# Patient Record
Sex: Female | Born: 1955 | Race: White | Hispanic: No | Smoking: Never smoker
Health system: Southern US, Community
[De-identification: ages and names within clinical notes are randomized; demographics above are authoritative.]

## PROBLEM LIST (undated history)

## (undated) DIAGNOSIS — M1611 Unilateral primary osteoarthritis, right hip: Secondary | ICD-10-CM

## (undated) DIAGNOSIS — R7989 Other specified abnormal findings of blood chemistry: Secondary | ICD-10-CM

## (undated) DIAGNOSIS — R011 Cardiac murmur, unspecified: Secondary | ICD-10-CM

## (undated) DIAGNOSIS — R7309 Other abnormal glucose: Secondary | ICD-10-CM

## (undated) DIAGNOSIS — M810 Age-related osteoporosis without current pathological fracture: Secondary | ICD-10-CM

## (undated) HISTORY — DX: Other abnormal glucose: R73.09

## (undated) HISTORY — DX: Cardiac murmur, unspecified: R01.1

## (undated) HISTORY — DX: Age-related osteoporosis without current pathological fracture: M81.0

## (undated) HISTORY — DX: Unilateral primary osteoarthritis, right hip: M16.11

## (undated) HISTORY — DX: Other specified abnormal findings of blood chemistry: R79.89

---

## 1999-05-22 ENCOUNTER — Encounter: Admission: RE | Admit: 1999-05-22 | Discharge: 1999-05-22 | Payer: Self-pay | Admitting: Obstetrics and Gynecology

## 1999-05-22 ENCOUNTER — Encounter: Payer: Self-pay | Admitting: Obstetrics and Gynecology

## 2000-02-18 ENCOUNTER — Other Ambulatory Visit: Admission: RE | Admit: 2000-02-18 | Discharge: 2000-02-18 | Payer: Self-pay | Admitting: *Deleted

## 2000-02-18 ENCOUNTER — Encounter: Payer: Self-pay | Admitting: *Deleted

## 2000-02-18 ENCOUNTER — Encounter: Admission: RE | Admit: 2000-02-18 | Discharge: 2000-02-18 | Payer: Self-pay | Admitting: *Deleted

## 2000-06-30 ENCOUNTER — Encounter: Admission: RE | Admit: 2000-06-30 | Discharge: 2000-06-30 | Payer: Self-pay | Admitting: *Deleted

## 2000-06-30 ENCOUNTER — Encounter: Payer: Self-pay | Admitting: *Deleted

## 2001-05-07 ENCOUNTER — Other Ambulatory Visit: Admission: RE | Admit: 2001-05-07 | Discharge: 2001-05-07 | Payer: Self-pay | Admitting: Obstetrics and Gynecology

## 2001-07-31 ENCOUNTER — Encounter: Payer: Self-pay | Admitting: *Deleted

## 2001-07-31 ENCOUNTER — Encounter: Admission: RE | Admit: 2001-07-31 | Discharge: 2001-07-31 | Payer: Self-pay | Admitting: *Deleted

## 2001-08-06 ENCOUNTER — Encounter: Payer: Self-pay | Admitting: *Deleted

## 2001-08-06 ENCOUNTER — Encounter: Admission: RE | Admit: 2001-08-06 | Discharge: 2001-08-06 | Payer: Self-pay | Admitting: *Deleted

## 2002-05-26 ENCOUNTER — Other Ambulatory Visit: Admission: RE | Admit: 2002-05-26 | Discharge: 2002-05-26 | Payer: Self-pay | Admitting: Obstetrics and Gynecology

## 2002-09-03 ENCOUNTER — Encounter: Payer: Self-pay | Admitting: Obstetrics and Gynecology

## 2002-09-03 ENCOUNTER — Encounter: Admission: RE | Admit: 2002-09-03 | Discharge: 2002-09-03 | Payer: Self-pay | Admitting: Obstetrics and Gynecology

## 2003-05-21 HISTORY — PX: BRAIN SURGERY: SHX531

## 2003-09-09 ENCOUNTER — Encounter: Admission: RE | Admit: 2003-09-09 | Discharge: 2003-09-09 | Payer: Self-pay | Admitting: Obstetrics and Gynecology

## 2003-09-30 ENCOUNTER — Other Ambulatory Visit: Admission: RE | Admit: 2003-09-30 | Discharge: 2003-09-30 | Payer: Self-pay | Admitting: Obstetrics and Gynecology

## 2003-12-30 ENCOUNTER — Encounter: Admission: RE | Admit: 2003-12-30 | Discharge: 2003-12-30 | Payer: Self-pay | Admitting: Obstetrics and Gynecology

## 2004-04-09 ENCOUNTER — Encounter: Admission: RE | Admit: 2004-04-09 | Discharge: 2004-04-09 | Payer: Self-pay | Admitting: Neurology

## 2004-04-25 ENCOUNTER — Encounter (INDEPENDENT_AMBULATORY_CARE_PROVIDER_SITE_OTHER): Payer: Self-pay | Admitting: Specialist

## 2004-04-25 ENCOUNTER — Inpatient Hospital Stay (HOSPITAL_COMMUNITY): Admission: RE | Admit: 2004-04-25 | Discharge: 2004-04-30 | Payer: Self-pay | Admitting: Neurosurgery

## 2004-07-06 ENCOUNTER — Encounter: Admission: RE | Admit: 2004-07-06 | Discharge: 2004-07-06 | Payer: Self-pay | Admitting: Obstetrics and Gynecology

## 2004-10-26 ENCOUNTER — Encounter: Admission: RE | Admit: 2004-10-26 | Discharge: 2004-10-26 | Payer: Self-pay | Admitting: Obstetrics and Gynecology

## 2005-11-21 ENCOUNTER — Encounter: Admission: RE | Admit: 2005-11-21 | Discharge: 2005-11-21 | Payer: Self-pay | Admitting: Obstetrics and Gynecology

## 2006-11-28 ENCOUNTER — Encounter: Admission: RE | Admit: 2006-11-28 | Discharge: 2006-11-28 | Payer: Self-pay | Admitting: Obstetrics and Gynecology

## 2007-12-04 ENCOUNTER — Encounter: Admission: RE | Admit: 2007-12-04 | Discharge: 2007-12-04 | Payer: Self-pay | Admitting: Obstetrics and Gynecology

## 2008-09-16 DIAGNOSIS — D229 Melanocytic nevi, unspecified: Secondary | ICD-10-CM

## 2008-09-16 HISTORY — DX: Melanocytic nevi, unspecified: D22.9

## 2008-12-16 ENCOUNTER — Encounter: Admission: RE | Admit: 2008-12-16 | Discharge: 2008-12-16 | Payer: Self-pay | Admitting: Obstetrics and Gynecology

## 2010-01-05 ENCOUNTER — Encounter: Admission: RE | Admit: 2010-01-05 | Discharge: 2010-01-05 | Payer: Self-pay | Admitting: Obstetrics and Gynecology

## 2010-10-05 NOTE — H&P (Signed)
NAME:  Melanie Patel, Melanie Patel               ACCOUNT NO.:  [id number]   MEDICAL RECORD NO.:  [id number]          PATIENT TYPE:  INP   LOCATION:  NA                           FACILITY:  [facility]   PHYSICIAN:  Payton Doughty, M.D.      DATE OF BIRTH:  Sep 24, 1955   DATE OF ADMISSION:  04/25/2004  DATE OF DISCHARGE:                                HISTORY & PHYSICAL   ADMITTING DIAGNOSIS:  Fourth ventricular tumor.   SERVICE:  Neurosurgery.   BODY OF TEXT:  This is a very nice 55 year old right-handed white woman who  in August of 2005 had an episode of diplopia that resolved.  She had another  episode week before last and ended up with an MRI that shows an enhancing  lesion of the fourth ventricle without hydrocephalus, and Dr. Genene Churn. Love  sent her over to me.   PAST MEDICAL HISTORY:  Medical history is benign.   MEDICATIONS:  None.   ALLERGIES:  None.   SURGICAL HISTORY:  None.   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   FAMILY HISTORY:  Family history is not given at this time.   REVIEW OF SYSTEMS:  Review of systems are unremarkable for anything except  double and blurred vision.   PHYSICAL EXAMINATION:  HEENT:  Her HEENT exam is within normal limits.  She  has no papilledema.  NECK:  Neck is supple with no lymphadenopathy.  CHEST:  Clear.  CARDIAC:  Regular rate and rhythm with a 1/6 systolic murmur.  ABDOMEN:  Abdomen is nontender with no hepatosplenomegaly.  EXTREMITIES:  Extremities without clubbing or cyanosis.  GU:  Exam is deferred.  PERIPHERAL VASCULAR:  Peripheral pulses are good.  NEUROLOGIC:  Neurologically, she is awake, alert and oriented.  Her cranial  nerves are intact.  Pupils are equal, round and reactive to light.  Extraocular movements are intact.  Facial movement and sensation are intact.  Tongue protrudes in the midline.  Shoulder shrug is normal.  She had some  twitching in the left face, but that is gone.  There are no  swallowing  difficulties.  Motor exam shows 5/5 strength throughout the upper and lower  extremities.  There is no pronator drift.  There is no evidence of dystaxia  and there is no Hoffmann's.  Reflexes are normal.   IMAGING STUDIES:  MR demonstrates an enhancing lesion of the fourth  ventricle.  It is heterogeneously enhancing to either side and __________  invades the ependymoma.  There is no evident edema associated with it and  the patient does not have hydrocephalus.  Secondly, she has a lesion in the  anterior pons which is enhancing, circular and somewhat less distinct.   CLINICAL IMPRESSION:  Fourth ventricular tumor.  Likely candidates include  ependymoma, subependymoma, choroid plexus papilloma or pilocystic  astrocytoma.  I doubt if it is a medulloblastoma in group.  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   PLAN:  Plan is for resection.  The risks and benefits of this approach  have  been discussed with her and she wishes to proceed.       MWR/MEDQ  D:  04/25/2004  T:  04/25/2004  Job:  [id number]

## 2010-10-05 NOTE — Op Note (Signed)
NAME:  HOLLEIGH, CRIHFIELD NO.:  [id number]   MEDICAL RECORD NO.:  [id number]          PATIENT TYPE:  INP   LOCATION:  2855                         FACILITY:  [facility]   PHYSICIAN:  Payton Doughty, M.D.      DATE OF BIRTH:  11-08-1955   DATE OF PROCEDURE:  04/25/2004  DATE OF DISCHARGE:                                 OPERATIVE REPORT   REFERRING PHYSICIAN:  Genene Churn. Love, M.D.   PREOPERATIVE DIAGNOSIS:  Fourth ventricular tumor.   POSTOPERATIVE DIAGNOSIS:  Fourth ventricular tumor.   PROCEDURE:  Posterior fossa craniotomy for tumor resection.   SURGEON:  Payton Doughty, M.D.   ANESTHESIA:  General endotracheal anesthesia.   PREPARATION:  Prepped and scrubbed with alcohol wipe.   COMPLICATIONS:  None.   NURSING ASSISTANT:  [place].   ASSISTANT:  Stefani Dama, M.D.   A 55 year old right-handed white female with tumor of the fourth ventricle.  Taken to the operating room, smoothly anesthetized and intubated.  Placed in  [place] headholder in the prone position.  Following shave, prep and drape  in the usual sterile fashion skin was infiltrated with 1% lidocaine with  1:400,000 epinephrine.  Skin was incised from the inion down to C2 and the  arch of C1 and the occipital squamo were exposed.  Craniectomy was performed  up to the transverse sinus about 4 cm wide.  I think the C1 was partly  removed.  The cerebellar hemispheres and tonsils as well as the vermis were  identified.  The dura was opened in cruciform pattern.  The cottonoid was  inserted up the obex into the floor of the fourth ventricle and some tumor  was visualized.  The vermis was resected in its inferior third exposing the  lateral margins of the tumor in fourth ventricle.  The tumor was grasped and  easily removed without difficulty. There was one vascular pedicle that was  coagulated and divided.  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  Fourth ventricle was carefully  inspected and no residual  tumors demonstrated.  Carefully irrigated and Valsalva demonstrated good  egress of spinal fluid into the fourth and out.  The entire brain was irrigated.  Hemostasis assured.  The dura  reapproximated in nonwatertight fashion and covered with Surgicel.  The  paraspinous muscle, fascia and skin were closed in successive layers of 2-0  Vicryl, 3-0 Vicryl and 3-0 nylon.  Betadine and Telfa dressing was applied.  The patient was returned to the recovery room.       MWR/MEDQ  D:  04/25/2004  T:  04/25/2004  Job:  [id number]   cc:   Genene Churn. Love, M.D.  [address]  [address]  [place]  [place] [address]  Fax: [phone]

## 2010-10-05 NOTE — Discharge Summary (Signed)
Melanie Patel, Melanie Patel               ACCOUNT NO.:  [id number]   MEDICAL RECORD NO.:  [id number]          PATIENT TYPE:  INP   LOCATION:  3103                         FACILITY:  [facility]   PHYSICIAN:  Payton Doughty, M.D.      DATE OF BIRTH:  1955-12-10   DATE OF ADMISSION:  04/25/2004  DATE OF DISCHARGE:  04/30/2004                                 DISCHARGE SUMMARY   ADMISSION DIAGNOSIS:  Posterior fossa brain tumor.   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   PROCEDURE:  Posterior fossa craniectomy for tumor resection.   HISTORY OF PRESENT ILLNESS:  This is a 55 year old white woman whose history  and physical is recounted on the chart.  She has had some episodes of  diplopia and MRI showed a posterior fossa tumor and she was sent to my  office.   PAST MEDICAL HISTORY:  Benign.   MEDICATIONS:  None.   ALLERGIES:  No known drug allergies.   SOCIAL HISTORY:  None.   PHYSICAL EXAMINATION:  GENERAL:  Intact.  NEUROLOGY:  Intact.   HOSPITAL COURSE:  She was admitted after ascertaining laboratory values and  underwent a posterior fossa tumor resection.  She did not require  ventriculostomy.  She has had some mild dystaxia, mild nausea and vomiting.  This has slowly resolved.  She has been up walking about.  She is now mildly  dystaxic.  Her strength is full.  She is eating and voiding normally.  She  is being discharged home in the care of her family.   FOLLOW UP:  She will follow up in the [facility]  office in a week.       MWR/MEDQ  D:  04/30/2004  T:  04/30/2004  Job:  [id number]

## 2010-12-24 ENCOUNTER — Other Ambulatory Visit: Payer: Self-pay | Admitting: Obstetrics and Gynecology

## 2010-12-24 DIAGNOSIS — Z1231 Encounter for screening mammogram for malignant neoplasm of breast: Secondary | ICD-10-CM

## 2011-01-18 ENCOUNTER — Ambulatory Visit
Admission: RE | Admit: 2011-01-18 | Discharge: 2011-01-18 | Disposition: A | Discharge status: Home / Self Care | Payer: BC Managed Care – PPO | Source: Ambulatory Visit | Attending: Obstetrics and Gynecology | Admitting: Obstetrics and Gynecology

## 2011-01-18 DIAGNOSIS — Z1231 Encounter for screening mammogram for malignant neoplasm of breast: Secondary | ICD-10-CM

## 2012-01-03 ENCOUNTER — Other Ambulatory Visit: Payer: Self-pay | Admitting: Obstetrics and Gynecology

## 2012-01-03 DIAGNOSIS — Z1231 Encounter for screening mammogram for malignant neoplasm of breast: Secondary | ICD-10-CM

## 2012-01-24 ENCOUNTER — Ambulatory Visit
Admission: RE | Admit: 2012-01-24 | Discharge: 2012-01-24 | Disposition: A | Discharge status: Home / Self Care | Payer: BC Managed Care – PPO | Source: Ambulatory Visit | Attending: Obstetrics and Gynecology | Admitting: Obstetrics and Gynecology

## 2012-01-24 DIAGNOSIS — Z1231 Encounter for screening mammogram for malignant neoplasm of breast: Secondary | ICD-10-CM

## 2012-05-08 ENCOUNTER — Other Ambulatory Visit: Payer: Self-pay | Admitting: Physician Assistant

## 2012-12-29 ENCOUNTER — Other Ambulatory Visit: Payer: Self-pay

## 2012-12-29 DIAGNOSIS — Z1231 Encounter for screening mammogram for malignant neoplasm of breast: Secondary | ICD-10-CM

## 2013-02-12 ENCOUNTER — Ambulatory Visit
Admission: RE | Admit: 2013-02-12 | Discharge: 2013-02-12 | Disposition: A | Discharge status: Home / Self Care | Payer: BC Managed Care – PPO | Source: Ambulatory Visit

## 2013-02-12 DIAGNOSIS — Z1231 Encounter for screening mammogram for malignant neoplasm of breast: Secondary | ICD-10-CM

## 2013-12-31 ENCOUNTER — Other Ambulatory Visit: Payer: Self-pay

## 2013-12-31 DIAGNOSIS — Z1231 Encounter for screening mammogram for malignant neoplasm of breast: Secondary | ICD-10-CM

## 2014-01-03 ENCOUNTER — Other Ambulatory Visit: Payer: Self-pay | Admitting: Obstetrics and Gynecology

## 2014-01-03 DIAGNOSIS — M949 Disorder of cartilage, unspecified: Principal | ICD-10-CM

## 2014-01-03 DIAGNOSIS — M899 Disorder of bone, unspecified: Secondary | ICD-10-CM

## 2014-02-18 ENCOUNTER — Ambulatory Visit
Admission: RE | Admit: 2014-02-18 | Discharge: 2014-02-18 | Disposition: A | Discharge status: Home / Self Care | Payer: BC Managed Care – PPO | Source: Ambulatory Visit

## 2014-02-18 ENCOUNTER — Ambulatory Visit
Admission: RE | Admit: 2014-02-18 | Discharge: 2014-02-18 | Disposition: A | Discharge status: Home / Self Care | Payer: BC Managed Care – PPO | Source: Ambulatory Visit | Attending: Obstetrics and Gynecology | Admitting: Obstetrics and Gynecology

## 2014-02-18 DIAGNOSIS — M899 Disorder of bone, unspecified: Secondary | ICD-10-CM

## 2014-02-18 DIAGNOSIS — M949 Disorder of cartilage, unspecified: Principal | ICD-10-CM

## 2014-02-18 DIAGNOSIS — Z1231 Encounter for screening mammogram for malignant neoplasm of breast: Secondary | ICD-10-CM

## 2015-01-24 ENCOUNTER — Other Ambulatory Visit: Payer: Self-pay

## 2015-01-24 DIAGNOSIS — Z1231 Encounter for screening mammogram for malignant neoplasm of breast: Secondary | ICD-10-CM

## 2015-03-10 ENCOUNTER — Ambulatory Visit: Payer: Self-pay

## 2015-04-25 ENCOUNTER — Other Ambulatory Visit: Payer: Self-pay | Admitting: Physician Assistant

## 2015-04-28 ENCOUNTER — Ambulatory Visit
Admission: RE | Admit: 2015-04-28 | Discharge: 2015-04-28 | Disposition: A | Discharge status: Home / Self Care | Payer: BLUE CROSS/BLUE SHIELD | Source: Ambulatory Visit

## 2015-04-28 DIAGNOSIS — Z1231 Encounter for screening mammogram for malignant neoplasm of breast: Secondary | ICD-10-CM

## 2015-12-01 ENCOUNTER — Other Ambulatory Visit: Payer: Self-pay | Admitting: Physician Assistant

## 2016-02-13 ENCOUNTER — Other Ambulatory Visit: Payer: Self-pay | Admitting: Obstetrics and Gynecology

## 2016-02-13 DIAGNOSIS — Z1231 Encounter for screening mammogram for malignant neoplasm of breast: Secondary | ICD-10-CM

## 2016-04-30 ENCOUNTER — Ambulatory Visit
Admission: RE | Admit: 2016-04-30 | Discharge: 2016-04-30 | Disposition: A | Discharge status: Home / Self Care | Payer: BLUE CROSS/BLUE SHIELD | Source: Ambulatory Visit | Attending: Obstetrics and Gynecology | Admitting: Obstetrics and Gynecology

## 2016-04-30 DIAGNOSIS — Z1231 Encounter for screening mammogram for malignant neoplasm of breast: Secondary | ICD-10-CM

## 2016-05-03 ENCOUNTER — Ambulatory Visit: Payer: BLUE CROSS/BLUE SHIELD

## 2016-05-10 ENCOUNTER — Encounter: Payer: Self-pay | Admitting: Obstetrics and Gynecology

## 2016-05-10 ENCOUNTER — Ambulatory Visit (INDEPENDENT_AMBULATORY_CARE_PROVIDER_SITE_OTHER): Payer: BLUE CROSS/BLUE SHIELD | Admitting: Obstetrics and Gynecology

## 2016-05-10 VITALS — BP 122/70 | HR 68 | Resp 14 | Ht 68.75 in | Wt 134.4 lb

## 2016-05-10 DIAGNOSIS — Z0000 Encounter for general adult medical examination without abnormal findings: Secondary | ICD-10-CM | POA: Diagnosis not present

## 2016-05-10 DIAGNOSIS — Z119 Encounter for screening for infectious and parasitic diseases, unspecified: Secondary | ICD-10-CM

## 2016-05-10 DIAGNOSIS — M810 Age-related osteoporosis without current pathological fracture: Secondary | ICD-10-CM | POA: Diagnosis not present

## 2016-05-10 DIAGNOSIS — Z01419 Encounter for gynecological examination (general) (routine) without abnormal findings: Secondary | ICD-10-CM

## 2016-05-10 LAB — LIPID PANEL
CHOL/HDL RATIO: 2.6 ratio (ref <5.0)
CHOLESTEROL: 245 mg/dL — AB (ref <200)
HDL: 96 mg/dL (ref >50)
LDL Cholesterol: 127 mg/dL — ABNORMAL HIGH (ref <100)
TRIGLYCERIDES: 109 mg/dL (ref <150)
VLDL: 22 mg/dL (ref <30)

## 2016-05-10 LAB — CBC
HEMATOCRIT: 43.1 % (ref 35.0–45.0)
HEMOGLOBIN: 14.1 g/dL (ref 11.7–15.5)
MCH: 29.6 pg (ref 27.0–33.0)
MCHC: 32.7 g/dL (ref 32.0–36.0)
MCV: 90.5 fL (ref 80.0–100.0)
MPV: 10.5 fL (ref 7.5–12.5)
Platelets: 259 10*3/uL (ref 140–400)
RBC: 4.76 MIL/uL (ref 3.80–5.10)
RDW: 13.9 % (ref 11.0–15.0)
WBC: 6.9 10*3/uL (ref 3.8–10.8)

## 2016-05-10 LAB — COMPREHENSIVE METABOLIC PANEL
ALBUMIN: 4.7 g/dL (ref 3.6–5.1)
ALT: 19 U/L (ref 6–29)
AST: 25 U/L (ref 10–35)
Alkaline Phosphatase: 51 U/L (ref 33–130)
BUN: 17 mg/dL (ref 7–25)
CALCIUM: 9.2 mg/dL (ref 8.6–10.4)
CHLORIDE: 103 mmol/L (ref 98–110)
CO2: 27 mmol/L (ref 20–31)
Creat: 0.7 mg/dL (ref 0.50–1.05)
Glucose, Bld: 83 mg/dL (ref 65–99)
POTASSIUM: 4.2 mmol/L (ref 3.5–5.3)
SODIUM: 139 mmol/L (ref 135–146)
Total Bilirubin: 0.5 mg/dL (ref 0.2–1.2)
Total Protein: 7.6 g/dL (ref 6.1–8.1)

## 2016-05-10 LAB — TSH: TSH: 1.5 mIU/L

## 2016-05-10 LAB — HEMOGLOBIN A1C
Hgb A1c MFr Bld: 5.5 % (ref <5.7)
MEAN PLASMA GLUCOSE: 111 mg/dL

## 2016-05-10 NOTE — Patient Instructions (Signed)

## 2016-05-10 NOTE — Progress Notes (Signed)
60 y.o. G31P0000 Married Caucasian female here for annual exam.    On Fosamax for osteoporosis for the last 2 years.  Would like to stop this.  Due for a bone density.   Mother had breast cancer, dx at age 26.  Deceased about 5 years later from metastatic disease.  Needs labs drawn.  Is a PT at [employer].  PCP:   No PCP  No LMP recorded. Patient is postmenopausal.     LMP was 05/10/2008   Sexually active: Yes.    The current method of family planning is female partner.    Exercising: Yes.    walking, elliptical, weights Smoker:  no  Health Maintenance: Pap:  1-2 years ago per patient unsure of exact date History of abnormal Pap:  no MMG:  04/30/16 BIRADS 1 negative Colonoscopy:  05/29/15 - polyp per patient normal BMD:   02/2014  Result  Osteoporosis.  Spine -1.7, L hip -2.7 TDaP:  Unsure  Hep C:  Today. Screening Labs:    Urine today:    reports that she has never smoked. She has never used smokeless tobacco. She reports that she drinks alcohol. She reports that she does not use drugs.  Past Medical History:  Diagnosis Date  . Heart murmur   . Osteoporosis     Past Surgical History:  Procedure Laterality Date  . BRAIN SURGERY  2005   Choroid Plexus papilloma Benign    Current Outpatient Prescriptions  Medication Sig Dispense Refill  . alendronate (FOSAMAX) 70 MG tablet Take 1 tablet by mouth once a week.  1  . Calcium Carbonate (CALCIUM 600 PO) Take 1 capsule by mouth 2 (two) times daily.    . Multiple Vitamins-Minerals (MULTIVITAMIN ADULT PO) Take 1 tablet by mouth daily.     No current facility-administered medications for this visit.     Family History  Problem Relation Age of Onset  . Breast cancer Mother   . Cancer Mother   . COPD Father   . Heart disease Father   . Deep vein thrombosis Father   . Osteoporosis Sister   . Multiple sclerosis Sister   . Bipolar disorder Sister     ROS:  Pertinent items are noted in HPI.  Otherwise, a  comprehensive ROS was negative.  Exam:   BP 122/70 (BP Location: Right Arm, Patient Position: Sitting, Cuff Size: Normal)   Pulse 68   Resp 14   Ht 5' 8.75" (1.746 m)   Wt 134 lb 6.4 oz (61 kg)   BMI 19.99 kg/m     General appearance: alert, cooperative and appears stated age Head: Normocephalic, without obvious abnormality, atraumatic Neck: no adenopathy, supple, symmetrical, trachea midline and thyroid normal to inspection and palpation Lungs: clear to auscultation bilaterally Breasts: normal appearance, no masses or tenderness, No nipple retraction or dimpling, No nipple discharge or bleeding, No axillary or supraclavicular adenopathy Heart: regular rate and rhythm Abdomen: soft, non-tender; no masses, no organomegaly Extremities: extremities normal, atraumatic, no cyanosis or edema Skin: Skin color, texture, turgor normal. No rashes or lesions Lymph nodes: Cervical, supraclavicular, and axillary nodes normal. No abnormal inguinal nodes palpated Neurologic: Grossly normal  Pelvic: External genitalia:  no lesions              Urethra:  normal appearing urethra with no masses, tenderness or lesions              Bartholins and Skenes: normal  Vagina: normal appearing vagina with normal color and discharge, no lesions              Cervix: no lesions              Pap taken: Yes.   Bimanual Exam:  Uterus:  normal size, contour, position, consistency, mobility, non-tender              Adnexa: no mass, fullness, tenderness              Rectal exam: Yes.  .  Confirms.              Anus:  normal sphincter tone, no lesions  Chaperone was present for exam.  Assessment:   Well woman visit with normal exam. FH of breast CA. Osteoporosis.  On Fosamax.  Plan: Mammogram screening discussed. Recommended self breast awareness. Pap and HR HPV as above. Guidelines for Calcium, Vitamin D, regular exercise program including cardiovascular and weight bearing exercise. Routine  labs, Hgb A1C, HIV, Hep C. BMD.  Follow up annually and prn.      After visit summary provided.

## 2016-05-11 LAB — HIV ANTIBODY (ROUTINE TESTING W REFLEX): HIV: NONREACTIVE

## 2016-05-11 LAB — VITAMIN D 25 HYDROXY (VIT D DEFICIENCY, FRACTURES): Vit D, 25-Hydroxy: 37 ng/mL (ref 30–100)

## 2016-05-11 LAB — HEPATITIS C ANTIBODY: HCV AB: NEGATIVE

## 2016-05-15 ENCOUNTER — Telehealth: Payer: Self-pay | Admitting: *Deleted

## 2016-05-15 LAB — IPS PAP TEST WITH HPV

## 2016-05-15 NOTE — Telephone Encounter (Signed)
-----   Message from Nunzio Cobbs, MD sent at 05/14/2016  6:29 PM EST ----- Please inform patient of results.  Cholesterol panel showed elevated total cholesterol at level of 245.  This is mostly because her good cholesterol, the HDL, was 96!   The LDL cholesterol was 127, which is just mildly elevated.   I am not concerned about these numbers as the ratios overall indicate low cardiovascular risk.  This was also not a fasting lab.   The thyroid, blood counts, blood chemistries, hemoglobin A1C, and vitamin D were all normal.  The HIV and hep C aby were negative.   Pap is pending.   Cc- Marisa Sprinkles

## 2016-05-15 NOTE — Telephone Encounter (Signed)
Left message to call Gentle Hoge at [phone].  

## 2016-05-16 LAB — POCT URINALYSIS DIPSTICK
Bilirubin, UA: NEGATIVE
Blood, UA: NEGATIVE
Glucose, UA: NEGATIVE
KETONES UA: NEGATIVE
LEUKOCYTES UA: NEGATIVE
Nitrite, UA: NEGATIVE
PH UA: 5
PROTEIN UA: NEGATIVE
UROBILINOGEN UA: NEGATIVE

## 2016-05-16 NOTE — Telephone Encounter (Signed)
Spoke with patient, advised of results as seen below per Dr. Quincy Simmonds. Patient verbalizes understanding and is agreeable. Results released to [place].  Routing to provider for final review. Patient is agreeable to disposition. Will close encounter.

## 2016-06-05 ENCOUNTER — Other Ambulatory Visit: Payer: BLUE CROSS/BLUE SHIELD

## 2016-06-11 ENCOUNTER — Ambulatory Visit
Admission: RE | Admit: 2016-06-11 | Discharge: 2016-06-11 | Disposition: A | Discharge status: Home / Self Care | Payer: BLUE CROSS/BLUE SHIELD | Source: Ambulatory Visit | Attending: Obstetrics and Gynecology | Admitting: Obstetrics and Gynecology

## 2016-06-11 DIAGNOSIS — M810 Age-related osteoporosis without current pathological fracture: Secondary | ICD-10-CM

## 2016-06-12 ENCOUNTER — Encounter: Payer: Self-pay | Admitting: Obstetrics and Gynecology

## 2016-06-12 ENCOUNTER — Other Ambulatory Visit: Payer: Self-pay | Admitting: Obstetrics and Gynecology

## 2016-06-12 MED ORDER — ALENDRONATE SODIUM 70 MG PO TABS: 70.0000 mg | ORAL_TABLET | ORAL | 11 refills | Status: DC

## 2017-05-17 ENCOUNTER — Other Ambulatory Visit: Payer: Self-pay | Admitting: Obstetrics and Gynecology

## 2017-05-19 NOTE — Telephone Encounter (Signed)
Medication refill request: fosamax Last AEX:  05/10/16 BS  Next AEX: detailed message left for patient to return call to schedule aex Last MMG (if hormonal medication request): 04/30/16 BIRADS 1 negative  Refill authorized: 06/12/16 #4, 11 RF. Today, please advise.

## 2017-05-21 ENCOUNTER — Other Ambulatory Visit: Payer: Self-pay | Admitting: Obstetrics and Gynecology

## 2017-05-21 DIAGNOSIS — Z1231 Encounter for screening mammogram for malignant neoplasm of breast: Secondary | ICD-10-CM

## 2017-06-13 ENCOUNTER — Ambulatory Visit
Admission: RE | Admit: 2017-06-13 | Discharge: 2017-06-13 | Disposition: A | Discharge status: Home / Self Care | Payer: BLUE CROSS/BLUE SHIELD | Source: Ambulatory Visit | Attending: Obstetrics and Gynecology | Admitting: Obstetrics and Gynecology

## 2017-06-13 DIAGNOSIS — Z1231 Encounter for screening mammogram for malignant neoplasm of breast: Secondary | ICD-10-CM

## 2017-07-11 ENCOUNTER — Ambulatory Visit: Payer: BLUE CROSS/BLUE SHIELD | Admitting: Obstetrics and Gynecology

## 2017-07-11 ENCOUNTER — Other Ambulatory Visit: Payer: Self-pay

## 2017-07-11 ENCOUNTER — Encounter: Payer: Self-pay | Admitting: Obstetrics and Gynecology

## 2017-07-11 VITALS — BP 126/78 | HR 72 | Resp 16 | Ht 68.25 in | Wt 132.0 lb

## 2017-07-11 DIAGNOSIS — Z23 Encounter for immunization: Secondary | ICD-10-CM

## 2017-07-11 DIAGNOSIS — Z01419 Encounter for gynecological examination (general) (routine) without abnormal findings: Secondary | ICD-10-CM | POA: Diagnosis not present

## 2017-07-11 NOTE — Patient Instructions (Signed)

## 2017-07-11 NOTE — Progress Notes (Signed)
62 y.o. G48P0000 Married Caucasian female here for annual exam.    Constipation.  States she needs to drink more water.   Has headache and not sure if she has sinus infection.  Declines evaluation.   Stopped Fosamax in December 2018. Wants to [place] with dentist now that she is off Fosamax.   PCP:   No PCP  Patient's last menstrual period was 05/10/2008.           Sexually active: Yes.    The current method of family planning is postmenopausal/ female partner.    Exercising: Yes.    walking, weight training, and biking Smoker:  no  Health Maintenance: Pap:  05/10/16 Pap and HR HPV negative History of abnormal Pap:  no MMG:  06/13/17 BIRADS 1 negative/density c Colonoscopy:  05/29/15 - polyp per patient normal BMD:   06/11/16  Result  Osteopenia TDaP:  unsure Gardasil:   n/a HIV and Hep C: 05/10/16 Negative Screening Labs:  Discuss today   reports that  has never smoked. she has never used smokeless tobacco. She reports that she drinks alcohol. She reports that she does not use drugs.  Past Medical History:  Diagnosis Date  . Heart murmur   . Osteoporosis     Past Surgical History:  Procedure Laterality Date  . BRAIN SURGERY  2005   Choroid Plexus papilloma Benign    Current Outpatient Medications  Medication Sig Dispense Refill  . Calcium Carbonate (CALCIUM 600 PO) Take 1 capsule by mouth 2 (two) times daily.    . Multiple Vitamins-Minerals (MULTIVITAMIN ADULT PO) Take 1 tablet by mouth daily.     No current facility-administered medications for this visit.     Family History  Problem Relation Age of Onset  . Breast cancer Mother 47  . Cancer Mother   . COPD Father   . Heart disease Father   . Deep vein thrombosis Father   . Osteoporosis Sister   . Multiple sclerosis Sister   . Bipolar disorder Sister     ROS:  Pertinent items are noted in HPI.  Otherwise, a comprehensive ROS was negative.  Exam:   BP 126/78 (BP Location: Right Arm, Patient  Position: Sitting, Cuff Size: Normal)   Pulse 72   Resp 16   Ht 5' 8.25" (1.734 m)   Wt 132 lb (59.9 kg)   LMP 05/10/2008   BMI 19.92 kg/m     General appearance: alert, cooperative and appears stated age Head: Normocephalic, without obvious abnormality, atraumatic Neck: no adenopathy, supple, symmetrical, trachea midline and thyroid normal to inspection and palpation Lungs: clear to auscultation bilaterally Breasts: normal appearance, no masses or tenderness, No nipple retraction or dimpling, No nipple discharge or bleeding, No axillary or supraclavicular adenopathy Heart: regular rate and rhythm Abdomen: soft, non-tender; no masses, no organomegaly Extremities: extremities normal, atraumatic, no cyanosis or edema Skin: Skin color, texture, turgor normal. No rashes or lesions Lymph nodes: Cervical, supraclavicular, and axillary nodes normal. No abnormal inguinal nodes palpated Neurologic: Grossly normal  Pelvic: External genitalia:  no lesions              Urethra:  normal appearing urethra with no masses, tenderness or lesions              Bartholins and Skenes: normal                 Vagina: normal appearing vagina with normal color and discharge, no lesions  Cervix: no lesions              Pap taken:  No.  Bimanual Exam:  Uterus:  normal size, contour, position, consistency, mobility, non-tender              Adnexa: no mass, fullness, tenderness              Rectal exam: Yes.  .  Confirms.              Anus:  normal sphincter tone, no lesions  Chaperone was present for exam.  Assessment:   Well woman visit with normal exam. FH of breast CA.  Mother.  Osteoporosis.  Off Fosamax.  Plan: Mammogram screening discussed. Recommended self breast awareness. Pap and HR HPV as above. Guidelines for Calcium, Vitamin D, regular exercise program including cardiovascular and weight bearing exercise. Rx for Lipid profile, Vit D, and HgbA1C through her work.  She may  decide she does not want to do this if the cost is high.  BMD next year.  TDap. Follow up annually and prn.    After visit summary provided.

## 2017-07-25 ENCOUNTER — Other Ambulatory Visit: Payer: Self-pay | Admitting: Obstetrics and Gynecology

## 2017-08-06 ENCOUNTER — Telehealth: Payer: Self-pay | Admitting: Obstetrics and Gynecology

## 2017-08-06 DIAGNOSIS — R7989 Other specified abnormal findings of blood chemistry: Secondary | ICD-10-CM

## 2017-08-06 DIAGNOSIS — E7800 Pure hypercholesterolemia, unspecified: Secondary | ICD-10-CM

## 2017-08-06 DIAGNOSIS — R7309 Other abnormal glucose: Secondary | ICD-10-CM

## 2017-08-06 NOTE — Telephone Encounter (Signed)
Left message to call [place] at [phone].  Need more information. Where labs done with another office?

## 2017-08-06 NOTE — Telephone Encounter (Signed)
Patient returning call.

## 2017-08-06 NOTE — Telephone Encounter (Signed)
Patient had labs done at [facility] 07/25/17 and has not received results. They were supposed to be sent to Dr. Quincy Simmonds, as well. Can leave detailed message [phone].

## 2017-08-06 NOTE — Telephone Encounter (Signed)
Spoke with patient who states that she had lab draw at [facility]. Orders were from [facility] and requested these be sent to our office. Advised will check received results and return call with further information. Patient is agreeable.

## 2017-08-06 NOTE — Telephone Encounter (Signed)
Labs to [place] for review and advise.

## 2017-08-07 LAB — SPECIMEN STATUS REPORT

## 2017-08-07 LAB — HGB A1C W/O EAG: Hgb A1c MFr Bld: 5.9 % — ABNORMAL HIGH (ref 4.8–5.6)

## 2017-08-07 LAB — LIPID PANEL W/O CHOL/HDL RATIO
Cholesterol, Total: 240 mg/dL — ABNORMAL HIGH (ref 100–199)
HDL: 90 mg/dL (ref >39)
LDL Calculated: 133 mg/dL — ABNORMAL HIGH (ref 0–99)
Triglycerides: 86 mg/dL (ref 0–149)
VLDL Cholesterol Cal: 17 mg/dL (ref 5–40)

## 2017-08-07 LAB — VITAMIN D 25 HYDROXY (VIT D DEFICIENCY, FRACTURES): VIT D 25 HYDROXY: 29 ng/mL — AB (ref 30.0–100.0)

## 2017-08-07 NOTE — Telephone Encounter (Signed)
Left message to call Hadi Dubin at [phone]. 

## 2017-08-07 NOTE — Telephone Encounter (Signed)
I have received a copy of the patient's labs from [facility].  T cholesterol 240, TG 86, HDL 90, VLDL 17, LDL 133.  HgbA1C 5.9, which puts her is a prediabetic range.  Vit D 29, which is just under normal.   I would recommend: - a low cholesterol and low sugar/carb diet and have fasting labs rechecked again in 3 months. - vit D3 over the counter 800 IU daily which can also be rechecked again in 3 months.   We will need to place a future order for these to be rechecked at her lab of choice.

## 2017-08-08 NOTE — Telephone Encounter (Signed)
Spoke with patient. Advised of message as seen below from [place]. Patient verbalizes understanding. Patient would like to have 3 month testing at outside [facility]. Orders placed. Patient will call once she has had these drawn so that we can ensure we receive results. Patient verbalizes understanding.  Routing to provider for final review. Patient agreeable to disposition. Will close encounter.

## 2017-08-11 ENCOUNTER — Encounter: Payer: Self-pay | Admitting: Obstetrics and Gynecology

## 2017-08-12 ENCOUNTER — Telehealth: Payer: Self-pay | Admitting: Obstetrics and Gynecology

## 2017-08-12 NOTE — Telephone Encounter (Signed)
Call to patient. RN advised Melanie Patel was calling to schedule 3 month lab follow up. Patient states she plans to have blood work done at [facility] on [address] because that is close to where she works, and she will call to schedule a mid-late June appointment. RN advised future orders are present for blood work and patient will need to call the office once she has blood work done so we can assure we receive results. Patient verbalized understanding.   Patient agreeable to disposition. Will close encounter.

## 2017-08-12 NOTE — Telephone Encounter (Signed)
Patient states she is returning a call to [facility]. Lovena Le is not in the office today.

## 2017-10-21 ENCOUNTER — Other Ambulatory Visit: Payer: Self-pay

## 2017-10-21 ENCOUNTER — Telehealth: Payer: Self-pay | Admitting: Obstetrics and Gynecology

## 2017-10-21 DIAGNOSIS — R7989 Other specified abnormal findings of blood chemistry: Secondary | ICD-10-CM

## 2017-10-21 DIAGNOSIS — R7309 Other abnormal glucose: Secondary | ICD-10-CM

## 2017-10-21 DIAGNOSIS — E7800 Pure hypercholesterolemia, unspecified: Secondary | ICD-10-CM

## 2017-10-21 NOTE — Telephone Encounter (Signed)
Patient stated she was supposed to be getting repeat labs. She went down to [facility] on [address], and they have not received it. Wanting to know if it can be faxed to her personally to Attn: Aadya Kindler at [phone], and she take it down there personally.

## 2017-10-21 NOTE — Telephone Encounter (Signed)
Reviewed future labs for hgba1c, lipid panel and Vit D. Labs released, confirmed with [facility] tech available to view, requisition printed.   Call returned to patient, advised as seen above, apologies for any inconvenience. Confirmed fax number provided, will fax copy of lab requisition per patient request.  Patient thankful for return call.   Routing to provider for final review. Patient is agreeable to disposition. Will close encounter.

## 2017-11-06 ENCOUNTER — Telehealth: Payer: Self-pay | Admitting: Obstetrics and Gynecology

## 2017-11-06 NOTE — Telephone Encounter (Signed)
Routing to Dr. Antony Blackbird, will close encounter.

## 2017-11-06 NOTE — Telephone Encounter (Signed)
Patient stated that Dr. Quincy Simmonds requested that she contact us once her labs have been done. She had them done this morning.

## 2017-11-07 LAB — LIPID PANEL
CHOL/HDL RATIO: 2.7 ratio (ref 0.0–4.4)
Cholesterol, Total: 202 mg/dL — ABNORMAL HIGH (ref 100–199)
HDL: 76 mg/dL (ref >39)
LDL Calculated: 112 mg/dL — ABNORMAL HIGH (ref 0–99)
Triglycerides: 72 mg/dL (ref 0–149)
VLDL Cholesterol Cal: 14 mg/dL (ref 5–40)

## 2017-11-07 LAB — VITAMIN D 25 HYDROXY (VIT D DEFICIENCY, FRACTURES): VIT D 25 HYDROXY: 34.8 ng/mL (ref 30.0–100.0)

## 2017-11-07 LAB — HEMOGLOBIN A1C
ESTIMATED AVERAGE GLUCOSE: 123 mg/dL
HEMOGLOBIN A1C: 5.9 % — AB (ref 4.8–5.6)

## 2017-11-09 ENCOUNTER — Encounter: Payer: Self-pay | Admitting: Obstetrics and Gynecology

## 2017-11-10 ENCOUNTER — Other Ambulatory Visit: Payer: Self-pay | Admitting: Obstetrics and Gynecology

## 2017-11-10 ENCOUNTER — Telehealth: Payer: Self-pay | Admitting: Obstetrics and Gynecology

## 2017-11-10 DIAGNOSIS — R7309 Other abnormal glucose: Secondary | ICD-10-CM

## 2017-11-10 DIAGNOSIS — E7800 Pure hypercholesterolemia, unspecified: Secondary | ICD-10-CM

## 2017-11-10 NOTE — Telephone Encounter (Signed)
OK for hemoglobin A1C and fasting lipid profile in 6 months.  We will need to place future orders or have the patient call us back in 6 months so I can order it again.  I am not certain where she would like to have her blood drawn.

## 2017-11-10 NOTE — Telephone Encounter (Signed)
Order signed by me

## 2017-11-10 NOTE — Telephone Encounter (Signed)
Spoke with patient. Advised of message as seen below from [place]. Patient verbalizes understanding. Would like to have labs done at [facility]. Orders placed and released. Requesting a paper copy be faxed to her at [phone] so that she has this on hand as well. Order to [facility] for review and signature.

## 2017-11-10 NOTE — Telephone Encounter (Signed)
Order faxed to patient per her request. Encounter closed.

## 2017-11-10 NOTE — Telephone Encounter (Signed)
Patient sent the following message through [place]. Routing to triage to assist patient with request.  ----- Message from [place], Generic sent at 11/10/2017 12:38 PM EDT -----    Thank you for your quick response.. I would like to retest in 6 mos if that is OK and then if still elevated possibly see an endocrinologist or Diabetes specialist. I am frustrated to say the least. Hope you are well.  Virdia  ----- Message -----  From: Arloa Koh, MD  Sent: 11/10/2017 7:29 AM EDT  To: Claris Pong  Subject: RE: Non-Urgent Medical Question  Hi Nevah,     I hope you received my message last night.   Please let me know if you would like to see an endocrinologist.     Have a good day!    Josefa Half    ----- Message -----   From: Claris Pong   Sent: 11/09/2017 12:19 PM EDT    To: Arloa Koh, MD  Subject: Non-Urgent Medical Question    Hi Dr. Quincy Simmonds,  Thank you for so quickly adding in my lab results. I was devastated with my AIC remaining the same as 3 months ago as I have totally changed my eating habits. I am a carb and sweet lover and didnt eat pastas, pizza, sweets etc for the entire 3 months.  I have continued to exercise as much as my time allows, but not as much as I would like. Not sure why my HDL's would be so much lower than they have been, but I am OK with the cholesterol panel. I did think my ratio here would decrease as well. Do you have any other thoghts for lowering my AIC. I do eat blueberries on a daily basis, but would adding in more fresh berries/fruit help? Are there any other natural supplements that might help? I do not have a family hx of diabetes. I know I stay stressed with work and don't get the sleep I should. Do you think tumeric might help? Any thoughts you might have are appreciated. Many thanks, Melonie

## 2017-11-10 NOTE — Telephone Encounter (Signed)
Left message to call Wynetta Seith at [phone].  

## 2017-12-22 ENCOUNTER — Encounter

## 2018-05-14 ENCOUNTER — Telehealth: Payer: Self-pay | Admitting: Obstetrics and Gynecology

## 2018-05-14 ENCOUNTER — Encounter: Payer: Self-pay | Admitting: Obstetrics and Gynecology

## 2018-05-14 NOTE — Telephone Encounter (Signed)
Call to patient and scheduled office visit for tomorrow with Dr. Quincy Simmonds.  05/15/2018.  Left message with details.  Encounter closed.

## 2018-05-14 NOTE — Telephone Encounter (Signed)
Patient would like to be seen for breast check. Doctor recommended she be seen today or tomorrow, however she works until 6 pm today. Can come anytime tomorrow. Patient states OK to schedule if she does not answer call back and leave details on voicemail.

## 2018-05-14 NOTE — Progress Notes (Signed)
GYNECOLOGY  VISIT   HPI: 62 y.o.   Married  Caucasian  female   G0P0000 with Patient's last menstrual period was 05/10/2008.   here for right breast discomfort.    Twinge of pain 2 weeks ago that came and went.  Occurred again Christmas.  No lumps on exam. No trauma.  No skin rash.   No hormonal medication.   GYNECOLOGIC HISTORY: Patient's last menstrual period was 05/10/2008. Contraception: Postmenopausal/female partner Menopausal hormone therapy: none Last mammogram:  06/13/17 BIRADS 1 negative/density c Last pap smear:  05/10/16 Pap and HR HPV negative        OB History    Gravida  0   Para  0   Term  0   Preterm  0   AB  0   Living  0     SAB  0   TAB  0   Ectopic  0   Multiple  0   Live Births  0              There are no active problems to display for this patient.   Past Medical History:  Diagnosis Date  . Elevated hemoglobin A1c   . Heart murmur   . Low vitamin D level   . Osteoporosis     Past Surgical History:  Procedure Laterality Date  . BRAIN SURGERY  2005   Choroid Plexus papilloma Benign    Current Outpatient Medications  Medication Sig Dispense Refill  . Calcium Carbonate (CALCIUM 600 PO) Take 1 capsule by mouth 2 (two) times daily.    . Cholecalciferol (VITAMIN D3 PO) Take 1 capsule by mouth daily. 1000 iu    . Multiple Vitamins-Minerals (MULTIVITAMIN ADULT PO) Take 1 tablet by mouth daily.     No current facility-administered medications for this visit.      ALLERGIES: Percocet [oxycodone-acetaminophen]  Family History  Problem Relation Age of Onset  . Breast cancer Mother 49  . Cancer Mother   . COPD Father   . Heart disease Father   . Deep vein thrombosis Father   . Osteoporosis Sister   . Multiple sclerosis Sister   . Bipolar disorder Sister     Social History   Socioeconomic History  . Marital status: Married    Spouse name: Not on file  . Number of children: Not on file  . Years of education: Not on  file  . Highest education level: Not on file  Occupational History  . Not on file  Social Needs  . Financial resource strain: Not on file  . Food insecurity:    Worry: Not on file    Inability: Not on file  . Transportation needs:    Medical: Not on file    Non-medical: Not on file  Tobacco Use  . Smoking status: Never Smoker  . Smokeless tobacco: Never Used  Substance and Sexual Activity  . Alcohol use: Yes    Comment: once a month  . Drug use: No  . Sexual activity: Yes    Comment: female partner  Lifestyle  . Physical activity:    Days per week: Not on file    Minutes per session: Not on file  . Stress: Not on file  Relationships  . Social connections:    Talks on phone: Not on file    Gets together: Not on file    Attends religious service: Not on file    Active member of club or organization: Not on file  Attends meetings of clubs or organizations: Not on file    Relationship status: Not on file  . Intimate partner violence:    Fear of current or ex partner: Not on file    Emotionally abused: Not on file    Physically abused: Not on file    Forced sexual activity: Not on file  Other Topics Concern  . Not on file  Social History Narrative  . Not on file    Review of Systems  All other systems reviewed and are negative.   PHYSICAL EXAMINATION:    BP 122/76 (BP Location: Right Arm, Patient Position: Sitting, Cuff Size: Normal)   Pulse 70   Ht 5' 8.25" (1.734 m)   Wt 128 lb (58.1 kg)   LMP 05/10/2008   BMI 19.32 kg/m     General appearance: alert, cooperative and appears stated age   Breasts: normal appearance, no masses or tenderness, No nipple retraction or dimpling, No nipple discharge or bleeding, No axillary or supraclavicular adenopathy  Chaperone was present for exam.  ASSESSMENT  Right breast pain.  FH breast cancer.   PLAN  Proceed with dx bilateral mammogram and right breast US.  Ibuprofen prn pain.    An After Visit Summary was  printed and given to the patient.  __15____ minutes face to face time of which over 50% was spent in counseling.

## 2018-05-15 ENCOUNTER — Ambulatory Visit: Payer: No Typology Code available for payment source | Admitting: Obstetrics and Gynecology

## 2018-05-15 ENCOUNTER — Other Ambulatory Visit: Payer: Self-pay

## 2018-05-15 ENCOUNTER — Encounter: Payer: Self-pay | Admitting: Obstetrics and Gynecology

## 2018-05-15 VITALS — BP 122/76 | HR 70 | Ht 68.25 in | Wt 128.0 lb

## 2018-05-15 DIAGNOSIS — N644 Mastodynia: Secondary | ICD-10-CM | POA: Diagnosis not present

## 2018-05-15 NOTE — Progress Notes (Signed)
Patient scheduled while in office for right breast Dx MMG and [place], if needed, at [facility] on 05/22/18 at 2:10pm, arriving at 1:50pm. Patient is agreeable to date and time.

## 2018-05-22 ENCOUNTER — Ambulatory Visit
Admission: RE | Admit: 2018-05-22 | Discharge: 2018-05-22 | Disposition: A | Discharge status: Home / Self Care | Payer: No Typology Code available for payment source | Source: Ambulatory Visit | Attending: Obstetrics and Gynecology | Admitting: Obstetrics and Gynecology

## 2018-05-22 ENCOUNTER — Ambulatory Visit: Payer: BLUE CROSS/BLUE SHIELD

## 2018-05-22 DIAGNOSIS — N644 Mastodynia: Secondary | ICD-10-CM

## 2018-05-29 ENCOUNTER — Other Ambulatory Visit: Payer: Self-pay | Admitting: Obstetrics and Gynecology

## 2018-05-29 DIAGNOSIS — Z1231 Encounter for screening mammogram for malignant neoplasm of breast: Secondary | ICD-10-CM

## 2018-06-24 ENCOUNTER — Ambulatory Visit
Admission: RE | Admit: 2018-06-24 | Discharge: 2018-06-24 | Disposition: A | Discharge status: Home / Self Care | Payer: PRIVATE HEALTH INSURANCE | Source: Ambulatory Visit | Attending: Obstetrics and Gynecology | Admitting: Obstetrics and Gynecology

## 2018-06-24 DIAGNOSIS — Z1231 Encounter for screening mammogram for malignant neoplasm of breast: Secondary | ICD-10-CM

## 2018-06-30 ENCOUNTER — Ambulatory Visit: Payer: No Typology Code available for payment source

## 2018-06-30 ENCOUNTER — Encounter: Payer: Self-pay | Admitting: Obstetrics and Gynecology

## 2018-07-01 ENCOUNTER — Telehealth: Payer: Self-pay | Admitting: Obstetrics and Gynecology

## 2018-07-01 NOTE — Telephone Encounter (Signed)
Patient sent the following correspondence through [place]. Routing to triage to assist patient with request.  Hi Dr. Quincy Simmonds,  I have my yearly with you at the end of February.  I would like to redo my lab work before I see you. Can you order my A1C, cholesterol and Vit D again. You can fax the order to me at [phone]. Attention : Cheral Almas.  Thank you. I look forward to seeing you in a few weeks.  Melanie Patel

## 2018-07-01 NOTE — Telephone Encounter (Signed)
I am happy to sign a prescription for routine labs. I would recommend doing a lipid profile, complete metabolic profile, vitamin D, CBC, and hemoglobin A1C. I will place this on your desk.

## 2018-07-01 NOTE — Telephone Encounter (Signed)
Faxed Rx.  Pt notified via mychart.  Encounter closed.

## 2018-07-01 NOTE — Telephone Encounter (Signed)
Dr. Quincy Simmonds,  Okay to place orders for the patient request?

## 2018-07-14 ENCOUNTER — Other Ambulatory Visit: Payer: Self-pay | Admitting: Physician Assistant

## 2018-07-14 DIAGNOSIS — C4491 Basal cell carcinoma of skin, unspecified: Secondary | ICD-10-CM

## 2018-07-14 HISTORY — DX: Basal cell carcinoma of skin, unspecified: C44.91

## 2018-07-15 ENCOUNTER — Ambulatory Visit: Payer: No Typology Code available for payment source | Admitting: Obstetrics and Gynecology

## 2018-07-15 ENCOUNTER — Encounter: Payer: Self-pay | Admitting: Obstetrics and Gynecology

## 2018-07-15 ENCOUNTER — Other Ambulatory Visit: Payer: Self-pay

## 2018-07-15 VITALS — BP 122/68 | HR 64 | Resp 16 | Ht 68.75 in | Wt 127.0 lb

## 2018-07-15 DIAGNOSIS — Z01419 Encounter for gynecological examination (general) (routine) without abnormal findings: Secondary | ICD-10-CM

## 2018-07-15 DIAGNOSIS — M8580 Other specified disorders of bone density and structure, unspecified site: Secondary | ICD-10-CM

## 2018-07-15 MED ORDER — ZOSTER VAC RECOMB ADJUVANTED 50 MCG/0.5ML IM SUSR: 0.5000 mL | Freq: Once | INTRAMUSCULAR | 1 refills | Status: AC

## 2018-07-15 NOTE — Progress Notes (Signed)
62 y.o. G58P0000 Married Caucasian female here for annual exam.    Her breast pain sensation resolved, so she did her screening mammogram instead of dx.   Had lab work last week at work.  This was done through [facility] 6 days ago.  Results are not available here. Patient reports the following: States A1C was 5.6.  Vit D was 40. Cholesterol high but ratios were good.   No vaginal bleeding.   Has seen dermatology.   Hx shingles.   PCP:   None  Patient's last menstrual period was 05/10/2008.           Sexually active: Yes.   Female The current method of family planning is post menopausal status.    Exercising: Yes.    weights and walking Smoker:  no  Health Maintenance: Pap: 05-10-16 Neg:Neg HR HPV History of abnormal Pap:  no MMG: 06-24-18 3D Neg/density C/BiRads1 Colonoscopy: 05-29-15 polyp per patient--next due 2019/2020 BMD:   06-11-16  Result :Osteopenia TDaP:  07-11-17 Gardasil:   no HIV: 05-10-16 NR Hep C: 05-10-16 Neg Screening Labs:  Done.  Flu vaccine:  Done.    reports that she has never smoked. She has never used smokeless tobacco. She reports current alcohol use. She reports that she does not use drugs.  Past Medical History:  Diagnosis Date  . Arthritis of right hip   . Elevated hemoglobin A1c   . Heart murmur   . Low vitamin D level   . Osteoporosis     Past Surgical History:  Procedure Laterality Date  . BRAIN SURGERY  2005   Choroid Plexus papilloma Benign    Current Outpatient Medications  Medication Sig Dispense Refill  . Calcium Carbonate (CALCIUM 600 PO) Take 1 capsule by mouth 2 (two) times daily.    . Cholecalciferol (VITAMIN D3 PO) Take 1 capsule by mouth daily. 1000 iu    . Multiple Vitamins-Minerals (MULTIVITAMIN ADULT PO) Take 1 tablet by mouth daily.    Marland Kitchen oseltamivir (TAMIFLU) 75 MG capsule Take 75 mg by mouth daily.     No current facility-administered medications for this visit.     Family History  Problem Relation Age of Onset  .  Breast cancer Mother 37  . Cancer Mother   . COPD Father   . Heart disease Father   . Deep vein thrombosis Father   . Osteoporosis Sister   . Multiple sclerosis Sister   . Bipolar disorder Sister     Review of Systems  All other systems reviewed and are negative.   Exam:   BP 122/68   Pulse 64   Resp 16   Ht 5' 8.75" (1.746 m)   Wt 127 lb (57.6 kg)   LMP 05/10/2008   BMI 18.89 kg/m     General appearance: alert, cooperative and appears stated age Head: Normocephalic, without obvious abnormality, atraumatic Neck: no adenopathy, supple, symmetrical, trachea midline and thyroid normal to inspection and palpation Lungs: clear to auscultation bilaterally Breasts: normal appearance, no masses or tenderness, No nipple retraction or dimpling, No nipple discharge or bleeding, No axillary or supraclavicular adenopathy Heart: regular rate and rhythm Abdomen: soft, non-tender; no masses, no organomegaly Extremities: extremities normal, atraumatic, no cyanosis or edema Skin: Skin color, texture, turgor normal. No rashes or lesions Lymph nodes: Cervical, supraclavicular, and axillary nodes normal. No abnormal inguinal nodes palpated Neurologic: Grossly normal  Pelvic: External genitalia:  no lesions              Urethra:  normal appearing urethra with no masses, tenderness or lesions              Bartholins and Skenes: normal                 Vagina: normal appearing vagina with normal color and discharge, no lesions              Cervix: no lesions              Pap taken: No. Bimanual Exam:  Uterus:  normal size, contour, position, consistency, mobility, non-tender              Adnexa: no mass, fullness, tenderness              Rectal exam: Yes.  .  Confirms.              Anus:  normal sphincter tone, no lesions  Chaperone was present for exam.  Assessment:   Well woman visit with normal exam. FH of breast CA.  Mother.  Osteoporosis. Off Fosamax.  Plan: Mammogram  screening. Recommended self breast awareness. Pap and HR HPV as above. Guidelines for Calcium, Vitamin D, regular exercise program including cardiovascular and weight bearing exercise. She will send a copy of her labs to me.  Shingrix vaccine.  Written Rx.  List of PCPs to patient.  [facility] at [facility]. Follow up annually and prn.   After visit summary provided.

## 2018-07-15 NOTE — Patient Instructions (Signed)

## 2018-09-18 ENCOUNTER — Other Ambulatory Visit: Payer: PRIVATE HEALTH INSURANCE

## 2018-12-14 ENCOUNTER — Encounter: Payer: Self-pay | Admitting: Obstetrics and Gynecology

## 2018-12-15 ENCOUNTER — Telehealth: Payer: Self-pay | Admitting: Obstetrics and Gynecology

## 2018-12-15 NOTE — Telephone Encounter (Signed)
Patient sent the following correspondence through [place]. Routing to triage to assist patient with request.  Hope you are doing well! I have my Bone Density Test on Fri , July 31. Do they have a referral from you? Just double checking since it seems ages ago that I saw you.  Many thanks,  Melanie Patel

## 2018-12-15 NOTE — Telephone Encounter (Signed)
Per review of Epic patient is scheduled for BMD at [facility] on 12/18/18, ordered by Dr. Quincy Simmonds.   Call returned to patient, left detailed message, name identified on voicemail, ok per dpr. Advised as seen above. Advised once BMD completed and reports reviewed by Dr. Quincy Simmonds our office will contact with results. Return call to office if any additional questions/concerns.   Encounter closed.

## 2018-12-18 ENCOUNTER — Other Ambulatory Visit: Payer: Self-pay

## 2018-12-18 ENCOUNTER — Ambulatory Visit
Admission: RE | Admit: 2018-12-18 | Discharge: 2018-12-18 | Disposition: A | Discharge status: Home / Self Care | Payer: PRIVATE HEALTH INSURANCE | Source: Ambulatory Visit | Attending: Obstetrics and Gynecology | Admitting: Obstetrics and Gynecology

## 2018-12-18 DIAGNOSIS — M8580 Other specified disorders of bone density and structure, unspecified site: Secondary | ICD-10-CM

## 2019-01-20 ENCOUNTER — Encounter: Payer: Self-pay | Admitting: Obstetrics and Gynecology

## 2019-11-03 ENCOUNTER — Other Ambulatory Visit: Payer: Self-pay | Admitting: Obstetrics and Gynecology

## 2019-11-03 DIAGNOSIS — Z1231 Encounter for screening mammogram for malignant neoplasm of breast: Secondary | ICD-10-CM

## 2019-11-10 ENCOUNTER — Ambulatory Visit
Admission: RE | Admit: 2019-11-10 | Discharge: 2019-11-10 | Disposition: A | Discharge status: Home / Self Care | Payer: PRIVATE HEALTH INSURANCE | Source: Ambulatory Visit | Attending: Obstetrics and Gynecology | Admitting: Obstetrics and Gynecology

## 2019-11-10 ENCOUNTER — Other Ambulatory Visit: Payer: Self-pay

## 2019-11-10 DIAGNOSIS — Z1231 Encounter for screening mammogram for malignant neoplasm of breast: Secondary | ICD-10-CM

## 2019-11-24 ENCOUNTER — Encounter: Payer: Self-pay | Admitting: *Deleted

## 2019-12-03 ENCOUNTER — Encounter: Payer: Self-pay | Admitting: Physician Assistant

## 2019-12-03 ENCOUNTER — Other Ambulatory Visit: Payer: Self-pay

## 2019-12-03 ENCOUNTER — Ambulatory Visit: Payer: PRIVATE HEALTH INSURANCE | Admitting: Physician Assistant

## 2019-12-03 DIAGNOSIS — Z85828 Personal history of other malignant neoplasm of skin: Secondary | ICD-10-CM | POA: Diagnosis not present

## 2019-12-03 DIAGNOSIS — Z1283 Encounter for screening for malignant neoplasm of skin: Secondary | ICD-10-CM | POA: Diagnosis not present

## 2019-12-03 DIAGNOSIS — L570 Actinic keratosis: Secondary | ICD-10-CM | POA: Diagnosis not present

## 2019-12-03 DIAGNOSIS — Z86018 Personal history of other benign neoplasm: Secondary | ICD-10-CM | POA: Diagnosis not present

## 2019-12-03 NOTE — Progress Notes (Signed)
   Follow-Up Visit   Subjective  Melanie Patel is a 64 y.o. female who presents for the following: Annual Exam (right arm- dry patch x months, weired purple dot on left & right elbow).   The following portions of the chart were reviewed this encounter and updated as appropriate: Tobacco  Allergies  Meds  Problems  Med Hx  Surg Hx  Fam Hx      Objective  Well appearing patient in no apparent distress; mood and affect are within normal limits.  A full examination was performed including scalp, head, eyes, ears, nose, lips, neck, chest, axillae, abdomen, back, buttocks, bilateral upper extremities, bilateral lower extremities, hands, feet, fingers, toes, fingernails, and toenails. All findings within normal limits unless otherwise noted below.  Objective  Head - to toe: No atypical nevi No signs of non-mole skin cancer.   Objective  Chest - Medial [facility]): No atypical nevi No signs of non-mole skin cancer.   Objective  left subauricular: Scar clear  Objective  Right Parotid Area: Erythematous patches with gritty scale.   Assessment & Plan  Screening exam for skin cancer Head - to toe  observe  History of dysplastic nevus Chest - Medial (Center)  observe  History of basal cell carcinoma (BCC) left subauricular  observe  AK (actinic keratosis) Right Parotid Area  Destruction of lesion - Right Parotid Area Complexity: simple   Destruction method: cryotherapy   Informed consent: discussed and consent obtained   Timeout:  patient name, date of birth, surgical site, and procedure verified Lesion destroyed using liquid nitrogen: Yes   Cryotherapy cycles:  1 Outcome: patient tolerated procedure well with no complications   Post-procedure details: wound care instructions given      I, Sayvon Arterberry, PA-C, have reviewed all documentation's for this visit.  The documentation on 12/03/19 for the exam, diagnosis, procedures and orders are all accurate  and complete.

## 2020-02-14 NOTE — Progress Notes (Signed)
64 y.o. G0P0000 Domestic Partner Caucasian female here for annual exam.    Building a new house.   Working at [employer], cancer patients.   Flu vaccine next week.   Has not had her Shingrix.  Had shingles 2 - 3 years ago.   Vaccinated against Covid.   PCP: None   Patient's last menstrual period was 05/10/2008.           Sexually active: Yes.    The current method of family planning is post menopausal status.    Exercising: Yes.    walking and weights Smoker:  no  Health Maintenance: Pap:  05-10-16 Neg:Neg HR HPV History of abnormal Pap:  no MMG: 11-10-19  3D/Neg/density C/BiRads1 Colonoscopy: 05-29-15 polyp per patient--next due 2019/2020--patient knows she needs to schedule BMD: 12-18-18 Result :Osteopenia of hip and spine TDaP: 01/2020 Gardasil:   no HIV:05-10-16 NR Hep C:05-10-16 Neg Screening Labs:  Will return for labs.    reports that she has never smoked. She has never used smokeless tobacco. She reports current alcohol use. She reports that she does not use drugs.  Past Medical History:  Diagnosis Date  . Arthritis of right hip   . 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 Atypical nevus 05/08/2012   mild-left outer foot  . Atypical nevus 04/25/2015   moderate-left leg (limitied margin free )  . Atypical nevus 12/01/2015   mild-right superior thigh   . Basal cell carcinoma 07/14/2018   super-left subauricular (CX35FU)  . Elevated hemoglobin A1c   . Heart murmur   . Low vitamin D level   . Osteoporosis     Past Surgical History:  Procedure Laterality Date  . BRAIN SURGERY  2005   Choroid Plexus papilloma Benign    Current Outpatient Medications  Medication Sig Dispense Refill  . Calcium Carbonate (CALCIUM 600 PO) Take 1 capsule by mouth 2 (two) times daily.    . Cholecalciferol (VITAMIN D3 PO) Take 1 capsule by mouth daily. 1000 iu    . Multiple Vitamins-Minerals (MULTIVITAMIN ADULT PO) Take 1 tablet by mouth  daily.     No current facility-administered medications for this visit.    Family History  Problem Relation Age of Onset  . Breast cancer Mother 23  . Cancer Mother   . COPD Father   . Heart disease Father   . Deep vein thrombosis Father   . Osteoporosis Sister   . Multiple sclerosis Sister   . Bipolar disorder Sister     Review of Systems  All other systems reviewed and are negative.   Exam:   BP 130/76   Pulse 70   Resp 16   Ht 5' 8.5" (1.74 m)   Wt 130 lb (59 kg)   LMP 05/10/2008   BMI 19.48 kg/m     General appearance: alert, cooperative and appears stated age Head: normocephalic, without obvious abnormality, atraumatic Neck: no adenopathy, supple, symmetrical, trachea midline and thyroid normal to inspection and palpation Lungs: clear to auscultation bilaterally Breasts: normal appearance, no masses or tenderness, No nipple retraction or dimpling, No nipple discharge or bleeding, No axillary adenopathy Heart: regular rate and rhythm Abdomen: soft, non-tender; no masses, no organomegaly Extremities: extremities normal, atraumatic, no cyanosis or edema Skin: skin color, texture, turgor normal. No rashes or lesions Lymph nodes: cervical, supraclavicular, and axillary nodes normal. Neurologic: grossly normal  Pelvic: External genitalia:  no lesions  No abnormal inguinal nodes palpated.              Urethra:  normal appearing urethra with no masses, tenderness or lesions              Bartholins and Skenes: normal                 Vagina: normal appearing vagina with normal color and discharge, no lesions              Cervix: no lesions              Pap taken: No. Bimanual Exam:  Uterus:  normal size, contour, position, consistency, mobility, non-tender              Adnexa: no mass, fullness, tenderness              Rectal exam: Yes.  .  Confirms.              Anus:  normal sphincter tone, no lesions  Chaperone was present for exam.  Assessment:    Well woman visit with normal exam. FH of breast CA.Mother. Osteoporosis. Off Fosamax in December 2018.  Hx elevated HgbA1C.  Plan: Mammogram screening discussed. Self breast awareness reviewed. Pap and HR HPV next year.  Guidelines for Calcium, Vitamin D, regular exercise program including cardiovascular and weight bearing exercise. Return for fasting labs.  BMD next year.  Follow up annually and prn.    After visit summary provided.

## 2020-02-15 ENCOUNTER — Ambulatory Visit (INDEPENDENT_AMBULATORY_CARE_PROVIDER_SITE_OTHER): Payer: No Typology Code available for payment source | Admitting: Obstetrics and Gynecology

## 2020-02-15 ENCOUNTER — Encounter: Payer: Self-pay | Admitting: Obstetrics and Gynecology

## 2020-02-15 ENCOUNTER — Other Ambulatory Visit: Payer: Self-pay

## 2020-02-15 VITALS — BP 130/76 | HR 70 | Resp 16 | Ht 68.5 in | Wt 130.0 lb

## 2020-02-15 DIAGNOSIS — Z01419 Encounter for gynecological examination (general) (routine) without abnormal findings: Secondary | ICD-10-CM | POA: Diagnosis not present

## 2020-02-15 NOTE — Patient Instructions (Signed)

## 2020-03-17 ENCOUNTER — Encounter: Payer: Self-pay | Admitting: Family Medicine

## 2020-03-17 ENCOUNTER — Other Ambulatory Visit: Payer: Self-pay

## 2020-03-17 ENCOUNTER — Ambulatory Visit (INDEPENDENT_AMBULATORY_CARE_PROVIDER_SITE_OTHER): Payer: No Typology Code available for payment source | Admitting: Family Medicine

## 2020-03-17 VITALS — BP 136/85 | HR 73 | Ht 68.5 in | Wt 127.4 lb

## 2020-03-17 DIAGNOSIS — Z0000 Encounter for general adult medical examination without abnormal findings: Secondary | ICD-10-CM

## 2020-03-17 DIAGNOSIS — R002 Palpitations: Secondary | ICD-10-CM

## 2020-03-17 DIAGNOSIS — R739 Hyperglycemia, unspecified: Secondary | ICD-10-CM

## 2020-03-17 NOTE — Progress Notes (Signed)
Office Visit Note   Patient: Melanie Patel           Date of Birth: [birth year]           MRN: [id number] Visit Date: 03/17/2020 Requested by: Nunzio Cobbs, MD [place],  [place] [id number] PCP: Nunzio Cobbs, MD  Subjective: Chief Complaint  Patient presents with  . establish primary care    HPI: She is here to establish care. She recently saw her gynecologist for her annual well woman examination, but she hasn't had a PCP in a while.  Overall she has been doing good health but about a week ago she developed palpitations. For about 10 minutes she was noticing irregularity of her heart rate. She borrowed a friend's watch which detects atrial fibrillation, and it was showing intermittent absence of P waves. She denied any shortness of breath, chest pain, nausea or diaphoresis during the episode. 2 days later she had a similar episode but did not last as long. 2 days after that she had another one. She has never had issues like this before. Other than a mild scratchy throat, she has been feeling well lately. She doesn't drink a lot of caffeine. She is not on any over-the-counter medicines other than vitamin D and calcium.  She does have a history of hyperglycemia which improved with moderation of her diet.  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 She enjoys her job but is having some trouble adjusting to the new EMR.  She had covid vaccine about 6 months ago.  She has not had covid illness.                  ROS:   All other systems were reviewed and are negative.  Objective: Vital Signs: BP 136/85   Pulse 73   Ht 5' 8.5" (1.74 m)   Wt 127 lb 6.4 oz (57.8 kg)   LMP 05/10/2008   BMI 19.09 kg/m   Physical Exam:  General:  Alert and oriented, in no acute distress. Pulm:  Breathing unlabored. Psy:  Normal mood, congruent affect. Skin:  No rash  HEENT:   [place]/AT, PERRLA, EOM Full, no nystagmus.  Funduscopic examination within normal limits.  No conjunctival erythema.  Tympanic membranes are pearly gray with normal landmarks.  External ear canals are normal.  Nasal passages are clear.  Oropharynx is slightly erythematous.  No significant lymphadenopathy.  No thyromegaly or nodules.  2+ carotid pulses without bruits.  Mild thinning of lateral right eyebrow. CV: Regular rate and rhythm without murmurs, rubs, or gallops.  No peripheral edema.  2+ radial and posterior tibial pulses. Lungs: Clear to auscultation throughout with no wheezing or areas of consolidation. Extremities: 2+ upper and lower DTRs. She does have some longitudinal ridging of a few of her fingernails.   Imaging: No results found.  Assessment & Plan: 1. Palpitations, etiology uncertain. Seems to be in sinus rhythm today. -We will draw labs to evaluate. -Baseline EKG in the near future. -Event monitoring if symptoms persist.  2. Hyperglycemia -Fasting labs with A1c to evaluate.      Procedures: No procedures performed  No notes on file     PMFS History: There are no problems to display for this patient.  Past Medical History:  Diagnosis Date  . Arthritis of right hip   . Atypical nevus 09/16/2008  slight-moderate-right media/chest  . Atypical nevus 05/08/2012   mild-left outer foot  . Atypical nevus 04/25/2015   moderate-left leg (limitied margin free )  . Atypical nevus 12/01/2015   mild-right superior thigh   . Basal cell carcinoma 07/14/2018   super-left subauricular (CX35FU)  . Elevated hemoglobin A1c   . Heart murmur   . Low vitamin D level   . Osteoporosis     Family History  Problem Relation Age of Onset  . Breast cancer Mother 77  . Cancer Mother   . COPD Father   . Heart disease Father   . Deep vein thrombosis Father   . Osteoporosis Sister   . Multiple sclerosis Sister   . Bipolar disorder Sister     Past Surgical History:  Procedure  Laterality Date  . BRAIN SURGERY  2005   Choroid Plexus papilloma Benign   Social History   Occupational History  . Not on file  Tobacco Use  . Smoking status: Never Smoker  . Smokeless tobacco: Never Used  Vaping Use  . Vaping Use: Never used  Substance and Sexual Activity  . Alcohol use: Yes    Comment: once a month  . Drug use: No  . Sexual activity: Yes    Comment: female partner

## 2020-03-18 ENCOUNTER — Encounter: Payer: Self-pay | Admitting: Family Medicine

## 2020-03-18 DIAGNOSIS — R002 Palpitations: Secondary | ICD-10-CM

## 2020-03-19 ENCOUNTER — Encounter: Payer: Self-pay | Admitting: Family Medicine

## 2020-03-20 ENCOUNTER — Other Ambulatory Visit: Payer: Self-pay | Admitting: Family Medicine

## 2020-03-20 ENCOUNTER — Encounter: Payer: Self-pay | Admitting: Family Medicine

## 2020-03-20 ENCOUNTER — Telehealth: Payer: Self-pay | Admitting: Family Medicine

## 2020-03-20 MED ORDER — AZITHROMYCIN 200 MG/5ML PO SUSR: ORAL | 0 refills | Status: DC

## 2020-03-20 MED ORDER — AZITHROMYCIN 250 MG PO TABS: ORAL_TABLET | ORAL | 0 refills | Status: DC

## 2020-03-20 MED FILL — AZITHROMYCIN 200 MG/5 ML SU: 200 | 5 days supply | Qty: 45 | Fill #0

## 2020-03-20 NOTE — Telephone Encounter (Signed)
There were also 2 MyChart messages about this. Dr. Junius Roads and I each responded to her through [place].

## 2020-03-20 NOTE — Telephone Encounter (Signed)
Pt called requesting a throat swab - worried about taking an antibiotic without knowing what is wrong..   Pt wanted to make sure she didn't have to take the EKG  Please advise

## 2020-03-20 NOTE — Telephone Encounter (Signed)
Please advise 

## 2020-03-20 NOTE — Telephone Encounter (Signed)
Ok to do a strep screen.

## 2020-03-20 NOTE — Addendum Note (Signed)
Addended by: Hortencia Pilar on: 03/20/2020 09:09 AM   Modules accepted: Orders

## 2020-03-21 ENCOUNTER — Other Ambulatory Visit (HOSPITAL_COMMUNITY): Payer: PRIVATE HEALTH INSURANCE

## 2020-03-21 MED ORDER — AMOXICILLIN 500 MG PO TABS: 1000.0000 mg | ORAL_TABLET | Freq: Two times a day (BID) | ORAL | 0 refills | Status: DC

## 2020-03-22 ENCOUNTER — Telehealth: Payer: Self-pay

## 2020-03-22 DIAGNOSIS — R002 Palpitations: Secondary | ICD-10-CM

## 2020-03-22 NOTE — Telephone Encounter (Signed)
Per Dr Burt Knack, ordered 3 day Zio for patient for palpitations. Scheduled her for new patient visit with Dr. Johney Frame 04/12/2020. The patient was grateful for call and agrees with treatment plan.

## 2020-03-23 ENCOUNTER — Encounter: Payer: Self-pay | Admitting: Family Medicine

## 2020-03-23 DIAGNOSIS — R002 Palpitations: Secondary | ICD-10-CM

## 2020-03-24 ENCOUNTER — Other Ambulatory Visit: Payer: Self-pay

## 2020-03-24 ENCOUNTER — Ambulatory Visit (INDEPENDENT_AMBULATORY_CARE_PROVIDER_SITE_OTHER): Payer: No Typology Code available for payment source

## 2020-03-24 DIAGNOSIS — Z0000 Encounter for general adult medical examination without abnormal findings: Secondary | ICD-10-CM

## 2020-03-24 DIAGNOSIS — R002 Palpitations: Secondary | ICD-10-CM

## 2020-03-24 DIAGNOSIS — R739 Hyperglycemia, unspecified: Secondary | ICD-10-CM

## 2020-03-24 NOTE — Progress Notes (Signed)
Fasting labs drawn today per Dr. Junius Roads: HgbA1C, vit. D, thyroid panel + TSH, lipid panel, hsCRP, CMET & CBC/diff.

## 2020-03-25 ENCOUNTER — Encounter: Payer: Self-pay | Admitting: Family Medicine

## 2020-03-25 LAB — COMPREHENSIVE METABOLIC PANEL
AG Ratio: 2 (calc) (ref 1.0–2.5)
ALT: 14 U/L (ref 6–29)
AST: 19 U/L (ref 10–35)
Albumin: 4.7 g/dL (ref 3.6–5.1)
Alkaline phosphatase (APISO): 55 U/L (ref 37–153)
BUN: 17 mg/dL (ref 7–25)
CO2: 27 mmol/L (ref 20–32)
Calcium: 9.9 mg/dL (ref 8.6–10.4)
Chloride: 101 mmol/L (ref 98–110)
Creat: 0.66 mg/dL (ref 0.50–0.99)
Globulin: 2.3 g/dL (calc) (ref 1.9–3.7)
Glucose, Bld: 83 mg/dL (ref 65–99)
Potassium: 4.1 mmol/L (ref 3.5–5.3)
Sodium: 138 mmol/L (ref 135–146)
Total Bilirubin: 0.7 mg/dL (ref 0.2–1.2)
Total Protein: 7 g/dL (ref 6.1–8.1)

## 2020-03-25 LAB — CBC WITH DIFFERENTIAL/PLATELET
Absolute Monocytes: 556 cells/uL (ref 200–950)
Basophils Absolute: 39 cells/uL (ref 0–200)
Basophils Relative: 0.7 %
Eosinophils Absolute: 61 cells/uL (ref 15–500)
Eosinophils Relative: 1.1 %
HCT: 41.9 % (ref 35.0–45.0)
Hemoglobin: 13.9 g/dL (ref 11.7–15.5)
Lymphs Abs: 1210 cells/uL (ref 850–3900)
MCH: 30.3 pg (ref 27.0–33.0)
MCHC: 33.2 g/dL (ref 32.0–36.0)
MCV: 91.3 fL (ref 80.0–100.0)
MPV: 11.6 fL (ref 7.5–12.5)
Monocytes Relative: 10.1 %
Neutro Abs: 3636 cells/uL (ref 1500–7800)
Neutrophils Relative %: 66.1 %
Platelets: 250 10*3/uL (ref 140–400)
RBC: 4.59 10*6/uL (ref 3.80–5.10)
RDW: 13 % (ref 11.0–15.0)
Total Lymphocyte: 22 %
WBC: 5.5 10*3/uL (ref 3.8–10.8)

## 2020-03-25 LAB — VITAMIN D 25 HYDROXY (VIT D DEFICIENCY, FRACTURES): Vit D, 25-Hydroxy: 89 ng/mL (ref 30–100)

## 2020-03-25 LAB — THYROID PANEL WITH TSH
Free Thyroxine Index: 3 (ref 1.4–3.8)
T3 Uptake: 32 % (ref 22–35)
T4, Total: 9.3 ug/dL (ref 5.1–11.9)
TSH: 1.73 mIU/L (ref 0.40–4.50)

## 2020-03-25 LAB — HEMOGLOBIN A1C
Hgb A1c MFr Bld: 5.6 % of total Hgb (ref <5.7)
Mean Plasma Glucose: 114 (calc)
eAG (mmol/L): 6.3 (calc)

## 2020-03-25 LAB — LIPID PANEL
Cholesterol: 232 mg/dL — ABNORMAL HIGH (ref <200)
HDL: 86 mg/dL (ref >=50)
LDL Cholesterol (Calc): 129 mg/dL (calc) — ABNORMAL HIGH
Non-HDL Cholesterol (Calc): 146 mg/dL (calc) — ABNORMAL HIGH (ref <130)
Total CHOL/HDL Ratio: 2.7 (calc) (ref <5.0)
Triglycerides: 73 mg/dL (ref <150)

## 2020-03-25 LAB — HIGH SENSITIVITY CRP: hs-CRP: <0.3 mg/L

## 2020-03-27 ENCOUNTER — Telehealth: Payer: Self-pay | Admitting: Family Medicine

## 2020-03-27 NOTE — Telephone Encounter (Signed)
Labs look great overall.  Total and LDL cholesterol are elevated, but HDL and triglycerides look great.  C-reactive protein is also normal.  Could contemplate ordering a CT calcium score to further assess cardiac risk:  [email or web address]  All else looks good.

## 2020-04-01 ENCOUNTER — Other Ambulatory Visit (INDEPENDENT_AMBULATORY_CARE_PROVIDER_SITE_OTHER): Payer: No Typology Code available for payment source

## 2020-04-01 DIAGNOSIS — R002 Palpitations: Secondary | ICD-10-CM

## 2020-04-10 NOTE — Progress Notes (Signed)
Cardiology Office Note:    Date:  04/12/2020   ID:  Melanie Patel, DOB 07-01-1955, MRN [id number]  PCP:  Nunzio Cobbs, MD  [place] Cardiologist:  No primary care provider on file.  [facility] Electrophysiologist:  None   Referring MD: Aundria Rud*    History of Present Illness:    Melanie Patel is a 64 y.o. female with a hx of osteroporosis who was referred by Dr. Judeth Horn for evaluation of palpitations.   Patient states that she first developed palpitations on 03/12/20 where she felt her heart pounding after a very stressful situation at work. She felt her pulse and noted that it was irregular like it was "skipping a beat." She put a smart watch on that said "possible afib." The symptoms resolved after about 10 minutes, but following that she noticed several other times where it felt like her heart beat was "irregular" and like it was "skipping." Most of these episodes lasted only several seconds to a couple of minutes before resolving on their own. On 11/2 she had another prolonged episode and checked her apple watch which said "possible Afib." She was not wearing the monitor at that time. No associated chest pain, diaphoresis, nausea, vomiting, lightheadedness, dizziness, or syncope. No known personal or family history of arrhythmias. Patient is otherwise healthy.   3 Day Zio patch revealed no afib, SVT or pauses. Triggered events correlated with PVCs.  Father had COPD and history of CAD in the setting of heavy tobacco use.  Past Medical History:  Diagnosis Date  . Arthritis of right hip   . 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 Atypical nevus 05/08/2012   mild-left outer foot  . Atypical nevus 04/25/2015   moderate-left leg (limitied margin free )  . Atypical nevus 12/01/2015   mild-right superior thigh   . Basal cell carcinoma 07/14/2018   super-left subauricular (CX35FU)  . Elevated hemoglobin A1c     . Heart murmur   . Low vitamin D level   . Osteoporosis     Past Surgical History:  Procedure Laterality Date  . 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 Calcium Carbonate (CALCIUM 600 PO) Take 1 capsule by mouth 2 (two) times daily.  . Cholecalciferol (VITAMIN D3 PO) Take 1 capsule by mouth daily. 1000 iu     Allergies:   Percocet [oxycodone-acetaminophen]   Social History   Socioeconomic History  . Marital status: Soil scientist    Spouse name: Not on file  . Number of children: Not on file  . Years of education: Not on file  . Highest education level: Not on file  Occupational History  . Not on file  Tobacco Use  . Smoking status: Never Smoker  . Smokeless tobacco: Never Used  Vaping Use  . Vaping Use: Never used  Substance and Sexual Activity  . Alcohol use: Yes    Comment: once a month  . Drug use: No  . Sexual activity: Yes    Comment: female partner  Other Topics Concern  . Not on file  Social History Narrative  . Not on file   Social Determinants of Health   Financial Resource Strain:   . Difficulty of Paying Living Expenses: Not on file  Food Insecurity:   . Worried About Charity fundraiser in the Last Year: Not on file  . Ran  Out of Food in the Last Year: Not on file  Transportation Needs:   . Lack of Transportation (Medical): Not on file  . Lack of Transportation (Non-Medical): Not on file  Physical Activity:   . Days of Exercise per Week: Not on file  . Minutes of Exercise per Session: Not on file  Stress:   . Feeling of Stress : Not on file  Social Connections:   . Frequency of Communication with Friends and Family: Not on file  . Frequency of Social Gatherings with Friends and Family: Not on file  . Attends Religious Services: Not on file  . Active Member of Clubs or Organizations: Not on file  . Attends Archivist Meetings: Not on file  . Marital  Status: Not on file     Family History: The patient's family history includes Bipolar disorder in her sister; Breast cancer (age of onset: 24) in her mother; COPD in her father; Cancer in her mother; Deep vein thrombosis in her father; Heart disease in her father; Multiple sclerosis in her sister; Osteoporosis in her sister.  ROS:   Please see the history of present illness.    Review of Systems  Constitutional: Negative for chills and fever.  HENT: Negative for hearing loss.   Eyes: Negative for blurred vision.  Respiratory: Negative for shortness of breath.   Cardiovascular: Positive for palpitations. Negative for chest pain, orthopnea, claudication, leg swelling and PND.  Gastrointestinal: Negative for heartburn, nausea and vomiting.  Genitourinary: Negative for dysuria.  Musculoskeletal: Negative for myalgias.  Neurological: Negative for dizziness and loss of consciousness.  Endo/Heme/Allergies: Negative for polydipsia.  Psychiatric/Behavioral: Negative for substance abuse.    EKGs/Labs/Other Studies Reviewed:    The following studies were reviewed today: Zio patch 3 day monitor: No Afib, SVT, VT or pauses. Symptoms typically correlated with PVCs.  EKG:  EKG is  ordered today.  The ekg ordered today demonstrates NSR with HR 92  Recent Labs: 03/24/2020: ALT 14; BUN 17; Creat 0.66; Hemoglobin 13.9; Platelets 250; Potassium 4.1; Sodium 138; TSH 1.73  Recent Lipid Panel    Component Value Date/Time   CHOL 232 (H) 03/24/2020 1250   CHOL 202 (H) 11/06/2017 0709   TRIG 73 03/24/2020 1250   HDL 86 03/24/2020 1250   HDL 76 11/06/2017 0709   CHOLHDL 2.7 03/24/2020 1250   VLDL 22 05/10/2016 1644   LDLCALC 129 (H) 03/24/2020 1250     Risk Assessment/Calculations:       Physical Exam:    VS:  BP 126/84   Pulse 67   Ht 5' 8.5" (1.74 m)   Wt 128 lb (58.1 kg)   LMP 05/10/2008   BMI 19.18 kg/m     Wt Readings from Last 3 Encounters:  04/12/20 128 lb (58.1 kg)  03/17/20  127 lb 6.4 oz (57.8 kg)  02/15/20 130 lb (59 kg)     GEN:  Well nourished, well developed in no acute distress HEENT: Normal NECK: No JVD; No carotid bruits CARDIAC: RRR, no murmurs, rubs, gallops RESPIRATORY:  Clear to auscultation without rales, wheezing or rhonchi  ABDOMEN: Soft, non-tender, non-distended MUSCULOSKELETAL:  No edema; No deformity  SKIN: Warm and dry NEUROLOGIC:  Alert and oriented x 3 PSYCHIATRIC:  Normal affect   ASSESSMENT:    1. Palpitations    PLAN:    In order of problems listed above:  #Palpitations: No Afib on the monitor but patient did not have significant symptoms while wearing the device. Symptoms correlate  with PVCs based on triggered events on the monitor, but it is concerning that her apple watch stated Afib during episodes back in October and early November when she was not wearing the heart monitor. Will do a long-term event monitor to further assess. Discussed option of loop recorder, but patient would like to defer at this time. Does not want medication like a BB as she states her symptoms are significantly improved. CHADs-vasc is 1 for gender and therefore she would not need AC at this time if Afib is present. TSH normal. Patient is not anemic -Check 30d cardiac monitor -Declined BB at this time -TSH normal, patient not anemic -CHADs-vasc 1 for gender and therefore would not [facility] for now if Afib detected -Pending monitor findings, can check TTE -Will follow-up with me after monitor complete  Medication Adjustments/Labs and Tests Ordered: Current medicines are reviewed at length with the patient today.  Concerns regarding medicines are outlined above.  Orders Placed This Encounter  Procedures  . CARDIAC EVENT MONITOR  . EKG 12-Lead   No orders of the defined types were placed in this encounter.   Patient Instructions  Medication Instructions:  No changes *If you need a refill on your cardiac medications before your next appointment,  please call your pharmacy*   Lab Work: none If you have labs (blood work) drawn today and your tests are completely normal, you will receive your results only by: Marland Kitchen MyChart Message (if you have MyChart) OR . A paper copy in the mail If you have any lab test that is abnormal or we need to change your treatment, we will call you to review the results.   Testing/Procedures: [employer] Cardiac Event Monitor Instructions Your physician has requested you wear your cardiac event monitor for 30 [employer] may call or text to confirm a shipping address. The monitor will be sent to a land address via UPS. [employer] will not ship a monitor to a PO BOX. It typically takes 3-5 days to receive your monitor after it has been enrolled. [employer] will assist with USPS tracking if your package is delayed. The telephone number for [employer] is [phone]. Once you have received your monitor, please review the enclosed instructions. Instruction tutorials can also be viewed under help and settings on the enclosed cell phone. Your monitor has already been registered assigning a specific monitor serial # to you.  Applying the monitor Remove cell phone from case and turn it on. The cell phone works as Dealer and needs to be within [employer] of you at all times. The cell phone will need to be charged on a daily basis. We recommend you plug the cell phone into the enclosed charger at your bedside table every night.  Monitor batteries: You will receive two monitor batteries labelled #1 and #2. These are your recorders. Plug battery #2 onto the second connection on the enclosed charger. Keep one battery on the charger at all times. This will keep the monitor battery deactivated. It will also keep it fully charged for when you need to switch your monitor batteries. A small light will be blinking on the battery emblem when it is charging. The light on the battery emblem will remain on when the  battery is fully charged.  Open package of a Monitor strip. Insert battery #1 into black hood on strip and gently squeeze monitor battery onto connection as indicated in instruction booklet. Set aside while preparing skin.  Choose location for your strip, vertical or horizontal, as  indicated in the instruction booklet. Shave to remove all hair from location. There cannot be any lotions, oils, powders, or colognes on skin where monitor is to be applied. Wipe skin clean with enclosed Saline wipe. Dry skin completely.  Peel paper labeled #1 off the back of the Monitor strip exposing the adhesive. Place the monitor on the chest in the vertical or horizontal position shown in the instruction booklet. One arrow on the monitor strip must be pointing upward. Carefully remove paper labeled #2, attaching remainder of strip to your skin. Try not to create any folds or wrinkles in the strip as you apply it.  Firmly press and release the circle in the center of the monitor battery. You will hear a small beep. This is turning the monitor battery on. The heart emblem on the monitor battery will light up every 5 seconds if the monitor battery in turned on and connected to the patient securely. Do not push and hold the circle down as this turns the monitor battery off. The cell phone will locate the monitor battery. A screen will appear on the cell phone checking the connection of your monitor strip. This may read poor connection initially but change to good connection within the next minute. Once your monitor accepts the connection you will hear a series of 3 beeps followed by a climbing crescendo of beeps. A screen will appear on the cell phone showing the two monitor strip placement options. Touch the picture that demonstrates where you applied the monitor strip.  Your monitor strip and battery are waterproof. You are able to shower, bathe, or swim with the monitor on. They just ask you do not submerge  deeper than 3 feet underwater. We recommend removing the monitor if you are swimming in a lake, river, or ocean.  Your monitor battery will need to be switched to a fully charged monitor battery approximately once a week. The cell phone will alert you of an action which needs to be made.  On the cell phone, tap for details to reveal connection status, monitor battery status, and cell phone battery status. The green dots indicates your monitor is in good status. A red dot indicates there is something that needs your attention.  To record a symptom, click the circle on the monitor battery. In 30-60 seconds a list of symptoms will appear on the cell phone. Select your symptom and tap save. Your monitor will record a sustained or significant arrhythmia regardless of you clicking the button. Some patients do not feel the heart rhythm irregularities. [employer] will notify us of any serious or critical events.  Refer to instruction booklet for instructions on switching batteries, changing strips, the Do not disturb or Pause features, or any additional questions.  Call [employer] at [phone], to confirm your monitor is transmitting and record your baseline. They will answer any questions you may have regarding the monitor instructions at that time.  Returning the monitor to [place] all equipment back into blue box. Peel off strip of paper to expose adhesive and close box securely. There is a prepaid UPS shipping label on this box. Drop in a UPS drop box, or at a UPS facility like [employer]. You may also contact [employer] to arrange UPS to pick up monitor package at your home.   Follow-Up: At [facility], you and your health needs are our priority.  As part of our continuing mission to provide you with exceptional heart care, we have created designated Provider Care Teams.  These Care Teams include your primary Cardiologist (physician) and Advanced Practice Providers (APPs -   Physician Assistants and Nurse Practitioners) who all work together to provide you with the care you need, when you need it.   Your next appointment:   2 month(s)  The format for your next appointment:   In Person  Provider:   Gwyndolyn Kaufman, MD   Other Instructions      Signed, Freada Bergeron, MD  04/12/2020 5:39 PM    [place]

## 2020-04-12 ENCOUNTER — Other Ambulatory Visit: Payer: Self-pay

## 2020-04-12 ENCOUNTER — Ambulatory Visit (INDEPENDENT_AMBULATORY_CARE_PROVIDER_SITE_OTHER): Payer: No Typology Code available for payment source | Admitting: Cardiology

## 2020-04-12 VITALS — BP 126/84 | HR 67 | Ht 68.5 in | Wt 128.0 lb

## 2020-04-12 DIAGNOSIS — R002 Palpitations: Secondary | ICD-10-CM | POA: Diagnosis not present

## 2020-04-12 NOTE — Patient Instructions (Addendum)
Medication Instructions:  No changes *If you need a refill on your cardiac medications before your next appointment, please call your pharmacy*   Lab Work: none If you have labs (blood work) drawn today and your tests are completely normal, you will receive your results only by: Marland Kitchen MyChart Message (if you have MyChart) OR . A paper copy in the mail If you have any lab test that is abnormal or we need to change your treatment, we will call you to review the results.   Testing/Procedures: [employer] Cardiac Event Monitor Instructions Your physician has requested you wear your cardiac event monitor for 30 [employer] may call or text to confirm a shipping address. The monitor will be sent to a land address via UPS. [employer] will not ship a monitor to a PO BOX. It typically takes 3-5 days to receive your monitor after it has been enrolled. [employer] will assist with USPS tracking if your package is delayed. The telephone number for [employer] is [phone]. Once you have received your monitor, please review the enclosed instructions. Instruction tutorials can also be viewed under help and settings on the enclosed cell phone. Your monitor has already been registered assigning a specific monitor serial # to you.  Applying the monitor Remove cell phone from case and turn it on. The cell phone works as Dealer and needs to be within [employer] of you at all times. The cell phone will need to be charged on a daily basis. We recommend you plug the cell phone into the enclosed charger at your bedside table every night.  Monitor batteries: You will receive two monitor batteries labelled #1 and #2. These are your recorders. Plug battery #2 onto the second connection on the enclosed charger. Keep one battery on the charger at all times. This will keep the monitor battery deactivated. It will also keep it fully charged for when you need to switch your monitor batteries. A small light will  be blinking on the battery emblem when it is charging. The light on the battery emblem will remain on when the battery is fully charged.  Open package of a Monitor strip. Insert battery #1 into black hood on strip and gently squeeze monitor battery onto connection as indicated in instruction booklet. Set aside while preparing skin.  Choose location for your strip, vertical or horizontal, as indicated in the instruction booklet. Shave to remove all hair from location. There cannot be any lotions, oils, powders, or colognes on skin where monitor is to be applied. Wipe skin clean with enclosed Saline wipe. Dry skin completely.  Peel paper labeled #1 off the back of the Monitor strip exposing the adhesive. Place the monitor on the chest in the vertical or horizontal position shown in the instruction booklet. One arrow on the monitor strip must be pointing upward. Carefully remove paper labeled #2, attaching remainder of strip to your skin. Try not to create any folds or wrinkles in the strip as you apply it.  Firmly press and release the circle in the center of the monitor battery. You will hear a small beep. This is turning the monitor battery on. The heart emblem on the monitor battery will light up every 5 seconds if the monitor battery in turned on and connected to the patient securely. Do not push and hold the circle down as this turns the monitor battery off. The cell phone will locate the monitor battery. A screen will appear on the cell phone checking the connection of your  monitor strip. This may read poor connection initially but change to good connection within the next minute. Once your monitor accepts the connection you will hear a series of 3 beeps followed by a climbing crescendo of beeps. A screen will appear on the cell phone showing the two monitor strip placement options. Touch the picture that demonstrates where you applied the monitor strip.  Your monitor strip and battery  are waterproof. You are able to shower, bathe, or swim with the monitor on. They just ask you do not submerge deeper than 3 feet underwater. We recommend removing the monitor if you are swimming in a lake, river, or ocean.  Your monitor battery will need to be switched to a fully charged monitor battery approximately once a week. The cell phone will alert you of an action which needs to be made.  On the cell phone, tap for details to reveal connection status, monitor battery status, and cell phone battery status. The green dots indicates your monitor is in good status. A red dot indicates there is something that needs your attention.  To record a symptom, click the circle on the monitor battery. In 30-60 seconds a list of symptoms will appear on the cell phone. Select your symptom and tap save. Your monitor will record a sustained or significant arrhythmia regardless of you clicking the button. Some patients do not feel the heart rhythm irregularities. [employer] will notify us of any serious or critical events.  Refer to instruction booklet for instructions on switching batteries, changing strips, the Do not disturb or Pause features, or any additional questions.  Call [employer] at [phone], to confirm your monitor is transmitting and record your baseline. They will answer any questions you may have regarding the monitor instructions at that time.  Returning the monitor to [place] all equipment back into blue box. Peel off strip of paper to expose adhesive and close box securely. There is a prepaid UPS shipping label on this box. Drop in a UPS drop box, or at a UPS facility like [employer]. You may also contact [employer] to arrange UPS to pick up monitor package at your home.   Follow-Up: At [facility], you and your health needs are our priority.  As part of our continuing mission to provide you with exceptional heart care, we have created designated Provider Care  Teams.  These Care Teams include your primary Cardiologist (physician) and Advanced Practice Providers (APPs -  Physician Assistants and Nurse Practitioners) who all work together to provide you with the care you need, when you need it.   Your next appointment:   2 month(s)  The format for your next appointment:   In Person  Provider:   Gwyndolyn Kaufman, MD   Other Instructions

## 2020-04-21 ENCOUNTER — Ambulatory Visit: Payer: No Typology Code available for payment source

## 2020-04-28 ENCOUNTER — Telehealth: Payer: Self-pay | Admitting: Radiology

## 2020-04-28 ENCOUNTER — Ambulatory Visit: Payer: PRIVATE HEALTH INSURANCE | Admitting: Physician Assistant

## 2020-04-28 NOTE — Telephone Encounter (Signed)
Enrolled patient for a 30 Day [employer] Event Monitor to be mailed to patients home.  

## 2020-05-03 ENCOUNTER — Ambulatory Visit (INDEPENDENT_AMBULATORY_CARE_PROVIDER_SITE_OTHER): Payer: No Typology Code available for payment source

## 2020-05-03 DIAGNOSIS — R002 Palpitations: Secondary | ICD-10-CM | POA: Diagnosis not present

## 2020-05-05 ENCOUNTER — Ambulatory Visit: Payer: PRIVATE HEALTH INSURANCE | Admitting: Physician Assistant

## 2020-06-08 NOTE — Progress Notes (Deleted)
Cardiology Office Note:    Date:  06/08/2020   ID:  Melanie Patel, DOB 04-23-1956, MRN [id number]  PCP:  Nunzio Cobbs, MD  [place] Cardiologist:  No primary care provider on file.  [facility] Electrophysiologist:  None   Referring MD: Aundria Rud*    History of Present Illness:    Melanie Patel is a 65 y.o. female with a hx of osteoporosis who presents to follow-up for palpitations.   During last visit on 04/12/20, the patient stated that she first developed palpitations on 03/12/20 where she felt her heart pounding after a very stressful situation at work. She felt her pulse and noted that it was irregular like it was "skipping a beat." She put a smart watch on that said "possible afib." The symptoms resolved after about 10 minutes, but following that she noticed several other times where it felt like her heart beat was "irregular" and like it was "skipping." Most of these episodes lasted only several seconds to a couple of minutes before resolving on their own. On 11/2 she had another prolonged episode and checked her apple watch which said "possible Afib." No associated chest pain, diaphoresis, nausea, vomiting, lightheadedness, dizziness, or syncope. No known personal or family history of arrhythmias. 3 Day Zio patch revealed no afib, SVT or pauses. Triggered events correlated with PVCs.  Past Medical History:  Diagnosis Date  . Arthritis of right hip   . 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 Atypical nevus 05/08/2012   mild-left outer foot  . Atypical nevus 04/25/2015   moderate-left leg (limitied margin free )  . Atypical nevus 12/01/2015   mild-right superior thigh   . Basal cell carcinoma 07/14/2018   super-left subauricular (CX35FU)  . Elevated hemoglobin A1c   . Heart murmur   . Low vitamin D level   . Osteoporosis     Past Surgical History:  Procedure Laterality Date  . BRAIN SURGERY  2005    Choroid Plexus papilloma Benign    Current Medications: No outpatient medications have been marked as taking for the 06/16/20 encounter (Appointment) with Freada Bergeron, MD.     Allergies:   Percocet [oxycodone-acetaminophen]   Social History   Socioeconomic History  . Marital status: Soil scientist    Spouse name: Not on file  . Number of children: Not on file  . Years of education: Not on file  . Highest education level: Not on file  Occupational History  . Not on file  Tobacco Use  . Smoking status: Never Smoker  . Smokeless tobacco: Never Used  Vaping Use  . Vaping Use: Never used  Substance and Sexual Activity  . Alcohol use: Yes    Comment: once a month  . Drug use: No  . Sexual activity: Yes    Comment: female partner  Other Topics Concern  . Not on file  Social History Narrative  . Not on file   Social Determinants of Health   Financial Resource Strain: Not on file  Food Insecurity: Not on file  Transportation Needs: Not on file  Physical Activity: Not on file  Stress: Not on file  Social Connections: Not on file     Family History: The patient's ***family history includes Bipolar disorder in her sister; Breast cancer (age of onset: 32) in her mother; COPD in her father; Cancer in her mother; Deep vein thrombosis in her father; Heart disease in her father; Multiple sclerosis in  her sister; Osteoporosis in her sister.  ROS:   Please see the history of present illness.    *** All other systems reviewed and are negative.  EKGs/Labs/Other Studies Reviewed:    The following studies were reviewed today: Cardiac monitor 04/01/20:  Patient wore the monitor for 3 days and 2 hours  Predominant rhythm was sinus with average HR 76 (ranged from 46-144)  Rare PVCs, rare PACs  No AFib, SVT, VT or significant pauses  Triggered events mainly correlated with PVCs    EKG:  EKG is *** ordered today.  The ekg ordered today demonstrates ***  Recent  Labs: 03/24/2020: ALT 14; BUN 17; Creat 0.66; Hemoglobin 13.9; Platelets 250; Potassium 4.1; Sodium 138; TSH 1.73  Recent Lipid Panel    Component Value Date/Time   CHOL 232 (H) 03/24/2020 1250   CHOL 202 (H) 11/06/2017 0709   TRIG 73 03/24/2020 1250   HDL 86 03/24/2020 1250   HDL 76 11/06/2017 0709   CHOLHDL 2.7 03/24/2020 1250   VLDL 22 05/10/2016 1644   LDLCALC 129 (H) 03/24/2020 1250     Risk Assessment/Calculations:   {Does this patient have ATRIAL FIBRILLATION?:[id number]}   Physical Exam:    VS:  LMP 05/10/2008     Wt Readings from Last 3 Encounters:  04/12/20 128 lb (58.1 kg)  03/17/20 127 lb 6.4 oz (57.8 kg)  02/15/20 130 lb (59 kg)     GEN: *** Well nourished, well developed in no acute distress HEENT: Normal NECK: No JVD; No carotid bruits LYMPHATICS: No lymphadenopathy CARDIAC: ***RRR, no murmurs, rubs, gallops RESPIRATORY:  Clear to auscultation without rales, wheezing or rhonchi  ABDOMEN: Soft, non-tender, non-distended MUSCULOSKELETAL:  No edema; No deformity  SKIN: Warm and dry NEUROLOGIC:  Alert and oriented x 3 PSYCHIATRIC:  Normal affect   ASSESSMENT:    No diagnosis found. PLAN:    In order of problems listed above:  1. ***   {Are you ordering a CV Procedure (e.g. stress test, cath, DCCV, TEE, etc)?   Press F2        :[id number]}    Medication Adjustments/Labs and Tests Ordered: Current medicines are reviewed at length with the patient today.  Concerns regarding medicines are outlined above.  No orders of the defined types were placed in this encounter.  No orders of the defined types were placed in this encounter.   There are no Patient Instructions on file for this visit.   Signed, Freada Bergeron, MD  06/08/2020 3:45 PM    [facility]

## 2020-06-13 ENCOUNTER — Telehealth: Payer: Self-pay | Admitting: *Deleted

## 2020-06-13 NOTE — Telephone Encounter (Signed)
-----   Message from Freada Bergeron, MD sent at 06/13/2020  9:22 AM EST ----- Cardiac monitor looked great. She has no significant arrhythmias. Her symptoms mainly correlated with extra beats from the top chamber of the heart called premature atrial contractions. These were pretty rare on the study (1%) and do not need treatment unless they are super bothersome. If so, we can start a low dose metoprolol.

## 2020-06-13 NOTE — Telephone Encounter (Signed)
I spoke with patient and reviewed results with her.  She is feeling fine and does not want to start any medication.  She would like to cancel follow up appointment since testing was OK.  I have cancelled this appointment and told her I would check with Dr Johney Frame to see if patient should be seen back in the future or follow up as needed.

## 2020-06-16 ENCOUNTER — Ambulatory Visit: Payer: No Typology Code available for payment source | Admitting: Cardiology

## 2020-06-23 NOTE — Telephone Encounter (Signed)
As needed is perfect. Thank you for following up!

## 2020-06-30 ENCOUNTER — Ambulatory Visit: Payer: No Typology Code available for payment source | Admitting: Cardiology

## 2020-10-06 ENCOUNTER — Encounter: Payer: Self-pay | Admitting: Family Medicine

## 2020-10-06 DIAGNOSIS — Z0000 Encounter for general adult medical examination without abnormal findings: Secondary | ICD-10-CM

## 2020-10-31 ENCOUNTER — Ambulatory Visit: Payer: No Typology Code available for payment source | Admitting: Physician Assistant

## 2020-12-15 ENCOUNTER — Ambulatory Visit
Admission: RE | Admit: 2020-12-15 | Discharge: 2020-12-15 | Disposition: A | Discharge status: Home / Self Care | Payer: No Typology Code available for payment source | Source: Ambulatory Visit | Attending: Family Medicine | Admitting: Family Medicine

## 2020-12-15 ENCOUNTER — Other Ambulatory Visit: Payer: Self-pay

## 2020-12-15 ENCOUNTER — Encounter: Payer: Self-pay | Admitting: Family Medicine

## 2020-12-15 DIAGNOSIS — Z0000 Encounter for general adult medical examination without abnormal findings: Secondary | ICD-10-CM

## 2020-12-15 DIAGNOSIS — Z1211 Encounter for screening for malignant neoplasm of colon: Secondary | ICD-10-CM

## 2020-12-15 DIAGNOSIS — Z01419 Encounter for gynecological examination (general) (routine) without abnormal findings: Secondary | ICD-10-CM

## 2020-12-19 ENCOUNTER — Telehealth: Payer: Self-pay | Admitting: Family Medicine

## 2020-12-19 NOTE — Telephone Encounter (Signed)
Mammogram looks normal!  [facility]

## 2020-12-21 ENCOUNTER — Encounter: Payer: Self-pay | Admitting: *Deleted

## 2021-01-06 ENCOUNTER — Encounter: Payer: Self-pay | Admitting: Family Medicine

## 2021-01-07 ENCOUNTER — Encounter: Payer: Self-pay | Admitting: Family Medicine

## 2021-01-07 DIAGNOSIS — Z1211 Encounter for screening for malignant neoplasm of colon: Secondary | ICD-10-CM

## 2021-01-19 ENCOUNTER — Other Ambulatory Visit (HOSPITAL_COMMUNITY): Payer: Self-pay

## 2021-01-19 MED ORDER — QUICKVUE AT-HOME COVID-19 TEST VI KIT
PACK | 0 refills | Status: DC
  Filled 2021-01-19: qty 2, 2d supply, fill #0

## 2021-01-31 ENCOUNTER — Other Ambulatory Visit: Payer: Self-pay

## 2021-01-31 ENCOUNTER — Ambulatory Visit (INDEPENDENT_AMBULATORY_CARE_PROVIDER_SITE_OTHER): Payer: No Typology Code available for payment source | Admitting: Physician Assistant

## 2021-01-31 ENCOUNTER — Encounter: Payer: Self-pay | Admitting: Physician Assistant

## 2021-01-31 DIAGNOSIS — Z86018 Personal history of other benign neoplasm: Secondary | ICD-10-CM | POA: Diagnosis not present

## 2021-01-31 DIAGNOSIS — Z85828 Personal history of other malignant neoplasm of skin: Secondary | ICD-10-CM | POA: Diagnosis not present

## 2021-01-31 DIAGNOSIS — L820 Inflamed seborrheic keratosis: Secondary | ICD-10-CM

## 2021-01-31 DIAGNOSIS — Z1283 Encounter for screening for malignant neoplasm of skin: Secondary | ICD-10-CM | POA: Diagnosis not present

## 2021-01-31 NOTE — Progress Notes (Signed)
   Follow-Up Visit   Subjective  Melanie Patel is a 65 y.o. female who presents for the following: Annual Exam (Full body yearly patient has history bcc and atypia).   The following portions of the chart were reviewed this encounter and updated as appropriate:  Tobacco  Allergies  Meds  Problems  Med Hx  Surg Hx  Fam Hx      Objective  Well appearing patient in no apparent distress; mood and affect are within normal limits.  A full examination was performed including scalp, head, eyes, ears, nose, lips, neck, chest, axillae, abdomen, back, buttocks, bilateral upper extremities, bilateral lower extremities, hands, feet, fingers, toes, fingernails, and toenails. All findings within normal limits unless otherwise noted below.  Neck - Anterior (2), Torso - Posterior (Back) (5) Thick crusty plaques on a pink base.  head to toe No atypical nevi No signs of non-mole skin cancer.    Assessment & Plan  Seborrheic keratosis, inflamed Neck - Anterior (2); Torso - Posterior (Back) (5)    Destruction of lesion - Neck - Anterior, Torso - Posterior (Back) Complexity: simple   Destruction method: cryotherapy   Informed consent: discussed and consent obtained   Timeout:  patient name, date of birth, surgical site, and procedure verified Lesion destroyed using liquid nitrogen: Yes   Cryotherapy cycles:  3 Outcome: patient tolerated procedure well with no complications   Post-procedure details: wound care instructions given    Encounter for screening for malignant neoplasm of skin head to toe  Yearly skin exams    I, Anthonio Mizzell, PA-C, have reviewed all documentation's for this visit.  The documentation on 01/31/21 for the exam, diagnosis, procedures and orders are all accurate and complete.

## 2021-02-02 ENCOUNTER — Other Ambulatory Visit (HOSPITAL_COMMUNITY): Payer: Self-pay

## 2021-02-02 MED ORDER — CARESTART COVID-19 HOME TEST VI KIT
PACK | 0 refills | Status: DC
  Filled 2021-02-02: qty 2, 2d supply, fill #0

## 2021-02-19 NOTE — Progress Notes (Signed)
65 y.o. G0P0000 Domestic Partner Caucasian female here for annual exam.    Wants routine labs.  Hoping to close on their house this week.  Working for [employer] now, at [employer].   PCP:  None   Patient's last menstrual period was 05/10/2008.           Sexually active: Yes.    The current method of family planning is post menopausal status.    Exercising: Yes.     Walking and some weights Smoker:  no  Health Maintenance: Pap: 05-10-16 Neg:Neg HR HPV History of abnormal Pap:  no MMG: 12-15-20 3D/Neg/Birads1 Colonoscopy:  05-29-15 polyp per patient--next due 2019/2020--patient trying to schedule--??needs referral BMD: 12-18-18 Result :Osteopenia of hip and spine TDaP:  01/2020 Gardasil:   no HIV: 05-10-16 NR Hep C: 05-10-16 Neg Screening Labs:  today.    reports that she has never smoked. She has never used smokeless tobacco. She reports current alcohol use. She reports that she does not use drugs.  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   slight-moderate-right media/chest   Atypical nevus 05/08/2012   mild-left outer foot   Atypical nevus 04/25/2015   moderate-left leg (limitied margin free )   Atypical nevus 12/01/2015   mild-right superior thigh    Basal cell carcinoma 07/14/2018   super-left subauricular (CX35FU)   Elevated hemoglobin A1c    Heart murmur    Low vitamin D level    Osteoporosis     Past Surgical History:  Procedure Laterality Date   BRAIN SURGERY  2005   Choroid Plexus papilloma Benign    Current Outpatient Medications  Medication Sig Dispense Refill   Calcium Carbonate (CALCIUM 600 PO) Take 1 capsule by mouth 2 (two) times daily.     Cholecalciferol (VITAMIN D3 PO) Take 1 capsule by mouth daily. 1000 iu     No current facility-administered medications for this visit.    Family History  Problem Relation Age of Onset   Breast cancer Mother 40   Cancer Mother    COPD Father    Heart disease  Father    Deep vein thrombosis Father    Osteoporosis Sister    Multiple sclerosis Sister    Bipolar disorder Sister     Review of Systems  All other systems reviewed and are negative.  Exam:   BP 120/68   Pulse 82   Ht 5\' 8"  (1.727 m)   Wt 130 lb (59 kg)   LMP 05/10/2008   SpO2 98%   BMI 19.77 kg/m     General appearance: alert, cooperative and appears stated age Head: normocephalic, without obvious abnormality, atraumatic Neck: no adenopathy, supple, symmetrical, trachea midline and thyroid normal to inspection and palpation Lungs: clear to auscultation bilaterally Breasts: normal appearance, no masses or tenderness, No nipple retraction or dimpling, No nipple discharge or bleeding, No axillary adenopathy Heart: regular rate and rhythm Abdomen: soft, non-tender; no masses, no organomegaly Extremities: extremities normal, atraumatic, no cyanosis or edema Skin: skin color, texture, turgor normal. No rashes or lesions Lymph nodes: cervical, supraclavicular, and axillary nodes normal. Neurologic: grossly normal  Pelvic: External genitalia:  no lesions              No abnormal inguinal nodes palpated.              Urethra:  normal appearing urethra with no masses, tenderness or lesions  Bartholins and Skenes: normal                 Vagina: normal appearing vagina with normal color and discharge, no lesions              Cervix: no lesions              Pap taken: no. Bimanual Exam:  Uterus:  normal size, contour, position, consistency, mobility, non-tender              Adnexa: no mass, fullness, tenderness              Rectal exam: yes.  Confirms.              Anus:  normal sphincter tone, no lesions  Chaperone was present for exam:  Estill Bamberg, CMA.  Assessment:   Well woman visit with gynecologic exam. FH of breast CA.  Mother.  Osteoporosis.  Off Fosamax in December 2018.  Hx elevated HgbA1C.  Plan: Mammogram screening discussed. Self breast awareness  reviewed. Pap and HR HPV as above. Guidelines for Calcium, Vitamin D, regular exercise program including cardiovascular and weight bearing exercise. Routine labs.  BMD. Follow up annually and prn.   After visit summary provided.

## 2021-02-20 ENCOUNTER — Ambulatory Visit (INDEPENDENT_AMBULATORY_CARE_PROVIDER_SITE_OTHER): Payer: No Typology Code available for payment source | Admitting: Obstetrics and Gynecology

## 2021-02-20 ENCOUNTER — Other Ambulatory Visit: Payer: Self-pay

## 2021-02-20 ENCOUNTER — Encounter: Payer: Self-pay | Admitting: Obstetrics and Gynecology

## 2021-02-20 ENCOUNTER — Other Ambulatory Visit (HOSPITAL_COMMUNITY)
Admission: RE | Admit: 2021-02-20 | Discharge: 2021-02-20 | Disposition: A | Discharge status: Home / Self Care | Payer: No Typology Code available for payment source | Source: Ambulatory Visit | Attending: Obstetrics and Gynecology | Admitting: Obstetrics and Gynecology

## 2021-02-20 VITALS — BP 120/68 | HR 82 | Ht 68.0 in | Wt 130.0 lb

## 2021-02-20 DIAGNOSIS — Z01419 Encounter for gynecological examination (general) (routine) without abnormal findings: Secondary | ICD-10-CM | POA: Insufficient documentation

## 2021-02-20 DIAGNOSIS — M8580 Other specified disorders of bone density and structure, unspecified site: Secondary | ICD-10-CM

## 2021-02-20 NOTE — Patient Instructions (Signed)

## 2021-02-21 LAB — CBC
HCT: 40.1 % (ref 35.0–45.0)
Hemoglobin: 13.2 g/dL (ref 11.7–15.5)
MCH: 29.7 pg (ref 27.0–33.0)
MCHC: 32.9 g/dL (ref 32.0–36.0)
MCV: 90.1 fL (ref 80.0–100.0)
MPV: 11 fL (ref 7.5–12.5)
Platelets: 249 10*3/uL (ref 140–400)
RBC: 4.45 10*6/uL (ref 3.80–5.10)
RDW: 12.7 % (ref 11.0–15.0)
WBC: 5.7 10*3/uL (ref 3.8–10.8)

## 2021-02-21 LAB — LIPID PANEL
Cholesterol: 237 mg/dL — ABNORMAL HIGH (ref <200)
HDL: 91 mg/dL (ref >=50)
LDL Cholesterol (Calc): 126 mg/dL (calc) — ABNORMAL HIGH
Non-HDL Cholesterol (Calc): 146 mg/dL (calc) — ABNORMAL HIGH (ref <130)
Total CHOL/HDL Ratio: 2.6 (calc) (ref <5.0)
Triglycerides: 95 mg/dL (ref <150)

## 2021-02-21 LAB — HEMOGLOBIN A1C
Hgb A1c MFr Bld: 5.6 % of total Hgb (ref <5.7)
Mean Plasma Glucose: 114 mg/dL
eAG (mmol/L): 6.3 mmol/L

## 2021-02-21 LAB — COMPREHENSIVE METABOLIC PANEL
AG Ratio: 2 (calc) (ref 1.0–2.5)
ALT: 15 U/L (ref 6–29)
AST: 19 U/L (ref 10–35)
Albumin: 4.7 g/dL (ref 3.6–5.1)
Alkaline phosphatase (APISO): 55 U/L (ref 37–153)
BUN: 16 mg/dL (ref 7–25)
CO2: 30 mmol/L (ref 20–32)
Calcium: 9.4 mg/dL (ref 8.6–10.4)
Chloride: 103 mmol/L (ref 98–110)
Creat: 0.7 mg/dL (ref 0.50–1.05)
Globulin: 2.3 g/dL (calc) (ref 1.9–3.7)
Glucose, Bld: 77 mg/dL (ref 65–99)
Potassium: 4.7 mmol/L (ref 3.5–5.3)
Sodium: 141 mmol/L (ref 135–146)
Total Bilirubin: 0.5 mg/dL (ref 0.2–1.2)
Total Protein: 7 g/dL (ref 6.1–8.1)

## 2021-02-21 LAB — VITAMIN D 25 HYDROXY (VIT D DEFICIENCY, FRACTURES): Vit D, 25-Hydroxy: 76 ng/mL (ref 30–100)

## 2021-02-22 LAB — CYTOLOGY - PAP
Comment: NEGATIVE
Diagnosis: NEGATIVE
High risk HPV: NEGATIVE

## 2021-03-09 ENCOUNTER — Other Ambulatory Visit (HOSPITAL_COMMUNITY): Payer: Self-pay

## 2021-03-09 MED ORDER — CARESTART COVID-19 HOME TEST VI KIT
PACK | 0 refills | Status: DC
  Filled 2021-03-09: qty 4, 4d supply, fill #0

## 2021-05-20 DIAGNOSIS — S52123A Displaced fracture of head of unspecified radius, initial encounter for closed fracture: Secondary | ICD-10-CM

## 2021-05-20 HISTORY — DX: Displaced fracture of head of unspecified radius, initial encounter for closed fracture: S52.123A

## 2021-08-10 ENCOUNTER — Other Ambulatory Visit: Payer: No Typology Code available for payment source

## 2021-08-17 ENCOUNTER — Other Ambulatory Visit (HOSPITAL_BASED_OUTPATIENT_CLINIC_OR_DEPARTMENT_OTHER): Payer: Self-pay

## 2021-08-17 MED ORDER — PEG 3350-KCL-NA BICARB-NACL 420 G PO SOLR
ORAL | 0 refills | Status: DC
  Filled 2021-08-17: qty 4000, 1d supply, fill #0

## 2021-09-26 ENCOUNTER — Other Ambulatory Visit (HOSPITAL_BASED_OUTPATIENT_CLINIC_OR_DEPARTMENT_OTHER): Payer: Self-pay

## 2021-09-26 MED ORDER — COVID-19 AT HOME ANTIGEN TEST VI KIT
PACK | 0 refills | Status: DC
  Filled 2021-09-26: qty 4, 8d supply, fill #0

## 2021-11-02 ENCOUNTER — Other Ambulatory Visit: Payer: Self-pay | Admitting: Obstetrics and Gynecology

## 2021-11-02 DIAGNOSIS — Z1231 Encounter for screening mammogram for malignant neoplasm of breast: Secondary | ICD-10-CM

## 2021-12-28 ENCOUNTER — Ambulatory Visit
Admission: RE | Admit: 2021-12-28 | Discharge: 2021-12-28 | Disposition: A | Discharge status: Home / Self Care | Payer: No Typology Code available for payment source | Source: Ambulatory Visit | Attending: Obstetrics and Gynecology | Admitting: Obstetrics and Gynecology

## 2021-12-28 DIAGNOSIS — M8580 Other specified disorders of bone density and structure, unspecified site: Secondary | ICD-10-CM

## 2021-12-28 DIAGNOSIS — Z1231 Encounter for screening mammogram for malignant neoplasm of breast: Secondary | ICD-10-CM

## 2022-01-02 ENCOUNTER — Encounter: Payer: Self-pay | Admitting: Obstetrics and Gynecology

## 2022-02-07 ENCOUNTER — Other Ambulatory Visit (HOSPITAL_BASED_OUTPATIENT_CLINIC_OR_DEPARTMENT_OTHER): Payer: Self-pay

## 2022-02-07 MED ORDER — FLUARIX QUADRIVALENT 0.5 ML IM SUSY
PREFILLED_SYRINGE | INTRAMUSCULAR | 0 refills | Status: DC
  Filled 2022-02-07: qty 0.5, 1d supply, fill #0

## 2022-02-26 ENCOUNTER — Encounter: Payer: Self-pay | Admitting: Obstetrics and Gynecology

## 2022-02-26 ENCOUNTER — Ambulatory Visit (INDEPENDENT_AMBULATORY_CARE_PROVIDER_SITE_OTHER): Payer: No Typology Code available for payment source | Admitting: Obstetrics and Gynecology

## 2022-02-26 VITALS — BP 120/80 | HR 66 | Resp 12 | Ht 68.25 in | Wt 132.0 lb

## 2022-02-26 DIAGNOSIS — Z01419 Encounter for gynecological examination (general) (routine) without abnormal findings: Secondary | ICD-10-CM

## 2022-02-26 DIAGNOSIS — Z0000 Encounter for general adult medical examination without abnormal findings: Secondary | ICD-10-CM

## 2022-02-26 DIAGNOSIS — M810 Age-related osteoporosis without current pathological fracture: Secondary | ICD-10-CM

## 2022-02-26 NOTE — Progress Notes (Signed)
66 y.o. [place] Domestic Partner Caucasian female here for annual exam.    Had a right radial head fracture this year.  She was cleared by her orthopedist to start a bisphosphonate in October or November.   Patient is asking about additional breast cancer screening methods.  Her mother had breast cancer age 76 and died from this.  No know genetic testing was done.  Patient had menarche at age 45 - 49 yo. She has not taken HRT.  No prior breast biopsy.   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   PCP:   Dr. Lindell Noe   Patient's last menstrual period was 05/10/2008.           Sexually active: Yes.    The current method of family planning is female partner.    Exercising: Yes.     Walking, weight training  Smoker:  no  Health Maintenance: Pap:  02-20-21 negative, HR HPV negative, 05-10-16 negative, HR HPV negative  History of abnormal Pap:  no MMG:  12-28-21 density C/BIRADS 1 negative Colonoscopy:  2023 at [place], 5 year f/u per patient  BMD:   12-28-21  Result  osteoporosis  TDaP:  2019  Gardasil:   no HIV: 05-10-16 negative  Hep C: 05-10-16 negative  Screening Labs:  Hb today: discuss with provider, Urine today: not collected    reports that she has never smoked. She has never used smokeless tobacco. She reports current alcohol use. She reports that she does not use drugs.  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   slight-moderate-right media/chest   Atypical nevus 05/08/2012   mild-left outer foot   Atypical nevus 04/25/2015   moderate-left leg (limitied margin free )   Atypical nevus 12/01/2015   mild-right superior thigh    Basal cell carcinoma 07/14/2018   super-left subauricular (CX35FU)   Elevated hemoglobin A1c    Heart murmur    Low vitamin D level    Osteoporosis    Radial head fracture 2023   right    Past Surgical History:  Procedure Laterality Date   BRAIN SURGERY  2005    Choroid Plexus papilloma Benign    Current Outpatient Medications  Medication Sig Dispense Refill   Calcium-Cholecalciferol-Zinc (VIACTIV CALCIUM IMMUNE) 650-20-5.5 MG-MCG-MG CHEW      Cholecalciferol (VITAMIN D3 PO) Take 5,000 Int'l Units by mouth.     No current facility-administered medications for this visit.    Family History  Problem Relation Age of Onset   Breast cancer Mother 34   Cancer Mother    COPD Father    Heart disease Father    Deep vein thrombosis Father    Osteoporosis Sister    Multiple sclerosis Sister    Bipolar disorder Sister     Review of Systems  All other systems reviewed and are negative.   Exam:   BP 120/80   Pulse 66   Resp 12   Ht 5' 8.25" (1.734 m)   Wt 132 lb (59.9 kg)   LMP 05/10/2008   BMI 19.92 kg/m     General appearance: alert, cooperative and appears stated age Head: normocephalic, without obvious abnormality, atraumatic Neck: no adenopathy, supple, symmetrical, trachea midline and thyroid normal to inspection and palpation Lungs: clear to auscultation bilaterally Breasts: normal appearance, no masses or tenderness, No nipple retraction or dimpling, No nipple discharge or bleeding, No axillary adenopathy Heart: regular rate and rhythm Abdomen:  soft, non-tender; no masses, no organomegaly Extremities: extremities normal, atraumatic, no cyanosis or edema Skin: skin color, texture, turgor normal. No rashes or lesions Lymph nodes: cervical, supraclavicular, and axillary nodes normal. Neurologic: grossly normal  Pelvic: External genitalia:  no lesions              No abnormal inguinal nodes palpated.              Urethra:  normal appearing urethra with no masses, tenderness or lesions              Bartholins and Skenes: normal                 Vagina: normal appearing vagina with normal color and discharge, no lesions              Cervix: no lesions              Pap taken: no Bimanual Exam:  Uterus:  normal size, contour,  position, consistency, mobility, non-tender              Adnexa: no mass, fullness, tenderness              Rectal exam: yes.  Confirms.              Anus:  normal sphincter tone, no lesions  Chaperone was present for exam:  yes.  Assessment:   Well woman visit with gynecologic exam. FH of breast CA.  Mother.  no known genetic testing.  Osteoporosis.  Off Fosamax in December 2018.  Right radial head fracture.  Hx elevated HgbA1C.  Plan: Mammogram screening discussed. Self breast awareness reviewed. Pap and HR HPV as above. Guidelines for Calcium, Vitamin D, regular exercise program including cardiovascular and weight bearing exercise. Cholesterol, A1C, CMP, CBC, PTH, TSH, phosphorus, vit D. If labs normal, will start weekly Fosamax.  Next BMD in 2 years.  Follow up annually and prn.   Tyrer Cusik model used to determine risk of breast cancer, completed after visit was done and sen to patient through My Chart.  I did not inquire about Ashkenazi [place] heritage, so I calculated 2 scenarios for her potential risk. - If no Ashkenazi Jewish heritage: 10 year risk if 7.4% and lifetime risk is 15.0%.  - If Ashkenazi Jewish heritage: 10 year risk if 7.6% and lifetime risk is 15.2%.  These are above the population risk but below the mark of 20% lifetime risk, which is used as the cut off for doing yearly breast MRI for high risk patients.   After visit summary provided.

## 2022-02-27 ENCOUNTER — Encounter: Payer: Self-pay | Admitting: Obstetrics and Gynecology

## 2022-02-27 LAB — PARATHYROID HORMONE, INTACT (NO CA): PTH: 41 pg/mL (ref 16–77)

## 2022-02-27 LAB — COMPREHENSIVE METABOLIC PANEL
AG Ratio: 2 (calc) (ref 1.0–2.5)
ALT: 20 U/L (ref 6–29)
AST: 22 U/L (ref 10–35)
Albumin: 4.5 g/dL (ref 3.6–5.1)
Alkaline phosphatase (APISO): 56 U/L (ref 37–153)
BUN: 18 mg/dL (ref 7–25)
CO2: 25 mmol/L (ref 20–32)
Calcium: 9.4 mg/dL (ref 8.6–10.4)
Chloride: 102 mmol/L (ref 98–110)
Creat: 0.71 mg/dL (ref 0.50–1.05)
Globulin: 2.3 g/dL (calc) (ref 1.9–3.7)
Glucose, Bld: 92 mg/dL (ref 65–99)
Potassium: 4 mmol/L (ref 3.5–5.3)
Sodium: 137 mmol/L (ref 135–146)
Total Bilirubin: 0.5 mg/dL (ref 0.2–1.2)
Total Protein: 6.8 g/dL (ref 6.1–8.1)

## 2022-02-27 LAB — LIPID PANEL
Cholesterol: 236 mg/dL — ABNORMAL HIGH (ref <200)
HDL: 79 mg/dL (ref >=50)
LDL Cholesterol (Calc): 136 mg/dL (calc) — ABNORMAL HIGH
Non-HDL Cholesterol (Calc): 157 mg/dL (calc) — ABNORMAL HIGH (ref <130)
Total CHOL/HDL Ratio: 3 (calc) (ref <5.0)
Triglycerides: 105 mg/dL (ref <150)

## 2022-02-27 LAB — CBC
HCT: 40.6 % (ref 35.0–45.0)
Hemoglobin: 13.7 g/dL (ref 11.7–15.5)
MCH: 30.1 pg (ref 27.0–33.0)
MCHC: 33.7 g/dL (ref 32.0–36.0)
MCV: 89.2 fL (ref 80.0–100.0)
MPV: 10.9 fL (ref 7.5–12.5)
Platelets: 273 10*3/uL (ref 140–400)
RBC: 4.55 10*6/uL (ref 3.80–5.10)
RDW: 13.1 % (ref 11.0–15.0)
WBC: 6 10*3/uL (ref 3.8–10.8)

## 2022-02-27 LAB — HEMOGLOBIN A1C
Hgb A1c MFr Bld: 5.9 % of total Hgb — ABNORMAL HIGH (ref <5.7)
Mean Plasma Glucose: 123 mg/dL
eAG (mmol/L): 6.8 mmol/L

## 2022-02-27 LAB — VITAMIN D 25 HYDROXY (VIT D DEFICIENCY, FRACTURES): Vit D, 25-Hydroxy: 78 ng/mL (ref 30–100)

## 2022-02-27 LAB — PHOSPHORUS: Phosphorus: 3.9 mg/dL (ref 2.1–4.3)

## 2022-02-27 LAB — TSH: TSH: 1.56 mIU/L (ref 0.40–4.50)

## 2022-02-28 DIAGNOSIS — M810 Age-related osteoporosis without current pathological fracture: Secondary | ICD-10-CM | POA: Insufficient documentation

## 2022-02-28 NOTE — Patient Instructions (Signed)

## 2022-03-01 ENCOUNTER — Other Ambulatory Visit: Payer: Self-pay | Admitting: Obstetrics and Gynecology

## 2022-03-01 ENCOUNTER — Other Ambulatory Visit (HOSPITAL_COMMUNITY): Payer: Self-pay

## 2022-03-01 MED ORDER — ALENDRONATE SODIUM 70 MG PO TABS
70.0000 mg | ORAL_TABLET | ORAL | 3 refills | Status: DC
  Filled 2022-03-01: qty 12, 84d supply, fill #0
  Filled 2022-05-31: qty 12, 84d supply, fill #1
  Filled 2022-05-31: qty 12, 84d supply, fill #0
  Filled 2022-08-18: qty 12, 84d supply, fill #1
  Filled 2022-11-10: qty 12, 84d supply, fill #2

## 2022-03-01 NOTE — Progress Notes (Signed)
Rx for Fosamax 70 mg po weekly.

## 2022-03-29 ENCOUNTER — Other Ambulatory Visit (HOSPITAL_BASED_OUTPATIENT_CLINIC_OR_DEPARTMENT_OTHER): Payer: Self-pay

## 2022-03-29 MED ORDER — COMIRNATY 30 MCG/0.3ML IM SUSY
PREFILLED_SYRINGE | INTRAMUSCULAR | 0 refills | Status: DC
  Filled 2022-03-29: qty 0.3, 1d supply, fill #0

## 2022-05-31 ENCOUNTER — Other Ambulatory Visit (HOSPITAL_BASED_OUTPATIENT_CLINIC_OR_DEPARTMENT_OTHER): Payer: Self-pay

## 2022-05-31 ENCOUNTER — Other Ambulatory Visit (HOSPITAL_COMMUNITY): Payer: Self-pay

## 2022-06-25 DIAGNOSIS — D2372 Other benign neoplasm of skin of left lower limb, including hip: Secondary | ICD-10-CM | POA: Diagnosis not present

## 2022-06-25 DIAGNOSIS — D2361 Other benign neoplasm of skin of right upper limb, including shoulder: Secondary | ICD-10-CM | POA: Diagnosis not present

## 2022-06-25 DIAGNOSIS — L570 Actinic keratosis: Secondary | ICD-10-CM | POA: Diagnosis not present

## 2022-06-25 DIAGNOSIS — L578 Other skin changes due to chronic exposure to nonionizing radiation: Secondary | ICD-10-CM | POA: Diagnosis not present

## 2022-06-25 DIAGNOSIS — L820 Inflamed seborrheic keratosis: Secondary | ICD-10-CM | POA: Diagnosis not present

## 2022-06-25 DIAGNOSIS — L821 Other seborrheic keratosis: Secondary | ICD-10-CM | POA: Diagnosis not present

## 2022-06-25 DIAGNOSIS — D1801 Hemangioma of skin and subcutaneous tissue: Secondary | ICD-10-CM | POA: Diagnosis not present

## 2022-06-25 DIAGNOSIS — L918 Other hypertrophic disorders of the skin: Secondary | ICD-10-CM | POA: Diagnosis not present

## 2022-06-25 DIAGNOSIS — D171 Benign lipomatous neoplasm of skin and subcutaneous tissue of trunk: Secondary | ICD-10-CM | POA: Diagnosis not present

## 2022-06-25 DIAGNOSIS — D485 Neoplasm of uncertain behavior of skin: Secondary | ICD-10-CM | POA: Diagnosis not present

## 2022-06-28 ENCOUNTER — Other Ambulatory Visit (HOSPITAL_BASED_OUTPATIENT_CLINIC_OR_DEPARTMENT_OTHER): Payer: Self-pay

## 2022-06-28 MED ORDER — SHINGRIX 50 MCG/0.5ML IM SUSR
INTRAMUSCULAR | 1 refills | Status: DC
  Filled 2022-06-28: qty 0.5, 1d supply, fill #0

## 2022-07-26 DIAGNOSIS — E7800 Pure hypercholesterolemia, unspecified: Secondary | ICD-10-CM | POA: Diagnosis not present

## 2022-07-26 DIAGNOSIS — Z0000 Encounter for general adult medical examination without abnormal findings: Secondary | ICD-10-CM | POA: Diagnosis not present

## 2022-10-04 ENCOUNTER — Other Ambulatory Visit (HOSPITAL_BASED_OUTPATIENT_CLINIC_OR_DEPARTMENT_OTHER): Payer: Self-pay

## 2022-10-04 MED ORDER — SHINGRIX 50 MCG/0.5ML IM SUSR
0.5000 mL | Freq: Once | INTRAMUSCULAR | 0 refills | Status: AC
  Filled 2022-10-04: qty 0.5, 1d supply, fill #0

## 2022-12-03 ENCOUNTER — Other Ambulatory Visit: Payer: Self-pay | Admitting: Obstetrics and Gynecology

## 2022-12-03 DIAGNOSIS — Z0000 Encounter for general adult medical examination without abnormal findings: Secondary | ICD-10-CM

## 2023-01-03 ENCOUNTER — Ambulatory Visit
Admission: RE | Admit: 2023-01-03 | Discharge: 2023-01-03 | Disposition: A | Discharge status: Home / Self Care | Payer: 59 | Source: Ambulatory Visit | Attending: Obstetrics and Gynecology | Admitting: Obstetrics and Gynecology

## 2023-01-03 DIAGNOSIS — Z0000 Encounter for general adult medical examination without abnormal findings: Secondary | ICD-10-CM

## 2023-01-03 DIAGNOSIS — Z1231 Encounter for screening mammogram for malignant neoplasm of breast: Secondary | ICD-10-CM | POA: Diagnosis not present

## 2023-01-24 ENCOUNTER — Other Ambulatory Visit (HOSPITAL_BASED_OUTPATIENT_CLINIC_OR_DEPARTMENT_OTHER): Payer: Self-pay

## 2023-01-24 MED ORDER — FLULAVAL 0.5 ML IM SUSY
0.5000 mL | PREFILLED_SYRINGE | Freq: Once | INTRAMUSCULAR | 0 refills | Status: AC
  Filled 2023-01-24: qty 0.5, 1d supply, fill #0

## 2023-02-07 ENCOUNTER — Other Ambulatory Visit (HOSPITAL_BASED_OUTPATIENT_CLINIC_OR_DEPARTMENT_OTHER): Payer: Self-pay

## 2023-02-07 ENCOUNTER — Other Ambulatory Visit: Payer: Self-pay | Admitting: Obstetrics and Gynecology

## 2023-02-10 ENCOUNTER — Other Ambulatory Visit (HOSPITAL_BASED_OUTPATIENT_CLINIC_OR_DEPARTMENT_OTHER): Payer: Self-pay

## 2023-02-10 MED ORDER — ALENDRONATE SODIUM 70 MG PO TABS
70.0000 mg | ORAL_TABLET | ORAL | 0 refills | Status: DC
  Filled 2023-02-10: qty 12, 84d supply, fill #0

## 2023-02-10 NOTE — Telephone Encounter (Signed)
Medication refill request: fosamax Last AEX:  02-26-22 Next AEX: 04-08-23 Last MMG (if hormonal medication request): n/a Refill authorized: please approve if appropriate

## 2023-03-25 NOTE — Progress Notes (Signed)
67 y.o. G0P0000 Domestic Partner Caucasian female here for annual exam.    Wants to do routine lab work today.   Taking Fosamax.  No problems.  Taking vit D 2500 mg.   Daughter is graduating from high school.  Working full time at the [employer] center.  PCP: Jonette Mate, MD  Patient's last menstrual period was 05/10/2008.           Sexually active: Yes.    The current method of family planning is female partner.    Menopausal hormone therapy:  n/a Exercising: Yes.     Walking, weight training Smoker:  no  OB History  Gravida Para Term Preterm AB Living  0 0 0 0 0 0  SAB IAB Ectopic Multiple Live Births  0 0 0 0 0     HEALTH MAINTENANCE:    Component Value Date/Time   DIAGPAP  02/20/2021 1556    - Negative for intraepithelial lesion or malignancy (NILM)   HPVHIGH Negative 02/20/2021 1556   ADEQPAP  02/20/2021 1556    Satisfactory for evaluation; transformation zone component PRESENT.    History of abnormal Pap or positive HPV:  no Mammogram: 01/03/23 Breast Density Cat C, BI-RADS CAT 1 neg Colonoscopy:  2023 at [facility] - due in 2028 Bone Density:  12/28/21  Result  osteoporotic   Immunization History  Administered Date(s) Administered   Influenza, Seasonal, Injecte, Preservative Fre 01/24/2023   Pfizer(Comirnaty)Fall Seasonal Vaccine 12 years and older 03/29/2022   Tdap 07/11/2017   Zoster Recombinant(Shingrix) 06/28/2022, 10/04/2022      reports that she has never smoked. She has never used smokeless tobacco. She reports current alcohol use. She reports that she does not use drugs.  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   slight-moderate-right media/chest   Atypical nevus 05/08/2012   mild-left outer foot   Atypical nevus 04/25/2015   moderate-left leg (limitied margin free )   Atypical nevus 12/01/2015   mild-right superior thigh    Basal cell carcinoma 07/14/2018   super-left  subauricular (CX35FU)   Elevated hemoglobin A1c    Heart murmur    Low vitamin D level    Osteoporosis    Radial head fracture 2023   right    Past Surgical History:  Procedure Laterality Date   BRAIN SURGERY  2005   Choroid Plexus papilloma Benign    Current Outpatient Medications  Medication Sig Dispense Refill   alendronate (FOSAMAX) 70 MG tablet Take 1 tablet (70 mg total) by mouth every 7 (seven) days. Take first thing in am with 6 oz. Water.  Be upright after taking.  Eat nothing for one hour. 12 tablet 0   Calcium-Cholecalciferol-Zinc (VIACTIV CALCIUM IMMUNE) 650-20-5.5 MG-MCG-MG CHEW      Cholecalciferol (VITAMIN D3 PO) Take 5,000 Int'l Units by mouth.     COVID-19 mRNA vaccine 2023-2024 (COMIRNATY) syringe Inject into the muscle. 0.3 mL 0   No current facility-administered medications for this visit.    ALLERGIES: Percocet [oxycodone-acetaminophen]  Family History  Problem Relation Age of Onset   Breast cancer Mother 34   Cancer Mother    COPD Father    Heart disease Father    Deep vein thrombosis Father    Osteoporosis Sister    Multiple sclerosis Sister    Bipolar disorder Sister     Review of Systems  All other systems reviewed and are negative.   PHYSICAL EXAM:  BP 124/80 (BP Location: Left Arm, Patient Position: Sitting, Cuff Size: Normal)   Pulse 67   Ht 5' 9.5" (1.765 m)   Wt 132 lb (59.9 kg)   LMP 05/10/2008   SpO2 97%   BMI 19.21 kg/m     General appearance: alert, cooperative and appears stated age Head: normocephalic, without obvious abnormality, atraumatic Neck: no adenopathy, supple, symmetrical, trachea midline and thyroid normal to inspection and palpation Lungs: clear to auscultation bilaterally Breasts: normal appearance, no masses or tenderness, No nipple retraction or dimpling, No nipple discharge or bleeding, No axillary adenopathy Heart: regular rate and rhythm Abdomen: soft, non-tender; no masses, no organomegaly Extremities:  extremities normal, atraumatic, no cyanosis or edema Skin: skin color, texture, turgor normal. No rashes or lesions Lymph nodes: cervical, supraclavicular, and axillary nodes normal. Neurologic: grossly normal  Pelvic: External genitalia:  no lesions              No abnormal inguinal nodes palpated.              Urethra:  normal appearing urethra with no masses, tenderness or lesions              Bartholins and Skenes: normal                 Vagina: normal appearing vagina with normal color and discharge, no lesions              Cervix: no lesions              Pap taken: No. Bimanual Exam:  Uterus:  normal size, contour, position, consistency, mobility, non-tender              Adnexa: no mass, fullness, tenderness              Rectal exam: Yes.  .  Confirms.              Anus:  normal sphincter tone, no lesions  Chaperone was present for exam:  Warren Lacy, CMA  ASSESSMENT: Well woman visit with gynecologic exam FH of breast CA.  Mother.  no known genetic testing.  Osteoporosis.  Off Fosamax in December 2018.  Restarted in 2023.  Right radial head fracture.  Hx elevated HgbA1C.  PLAN: Mammogram screening discussed. Self breast awareness reviewed. Pap and HRV collected:  No.  Due in 2027. Guidelines for Calcium, Vitamin D, regular exercise program including cardiovascular and weight bearing exercise. Medication refills:  Fosamax 70 mg weekly.  #12, RF 3.  BMD due in August, 2025.  This was ordered today.  Routine labs drawn.  Follow up:  annual exam in one year.    An After Visit Summary was provided to the patient.

## 2023-04-08 ENCOUNTER — Ambulatory Visit (INDEPENDENT_AMBULATORY_CARE_PROVIDER_SITE_OTHER): Payer: 59 | Admitting: Obstetrics and Gynecology

## 2023-04-08 ENCOUNTER — Other Ambulatory Visit (HOSPITAL_BASED_OUTPATIENT_CLINIC_OR_DEPARTMENT_OTHER): Payer: Self-pay

## 2023-04-08 ENCOUNTER — Encounter: Payer: Self-pay | Admitting: Obstetrics and Gynecology

## 2023-04-08 VITALS — BP 124/80 | HR 67 | Ht 69.5 in | Wt 132.0 lb

## 2023-04-08 DIAGNOSIS — Z0000 Encounter for general adult medical examination without abnormal findings: Secondary | ICD-10-CM | POA: Diagnosis not present

## 2023-04-08 DIAGNOSIS — M810 Age-related osteoporosis without current pathological fracture: Secondary | ICD-10-CM

## 2023-04-08 DIAGNOSIS — Z01419 Encounter for gynecological examination (general) (routine) without abnormal findings: Secondary | ICD-10-CM

## 2023-04-08 DIAGNOSIS — E559 Vitamin D deficiency, unspecified: Secondary | ICD-10-CM | POA: Diagnosis not present

## 2023-04-08 DIAGNOSIS — E785 Hyperlipidemia, unspecified: Secondary | ICD-10-CM | POA: Diagnosis not present

## 2023-04-08 MED ORDER — ALENDRONATE SODIUM 70 MG PO TABS
70.0000 mg | ORAL_TABLET | ORAL | 3 refills | Status: DC
  Filled 2023-04-08 – 2023-05-08 (×2): qty 12, 84d supply, fill #0
  Filled 2023-07-30: qty 12, 84d supply, fill #1
  Filled 2023-10-24: qty 12, 84d supply, fill #2
  Filled 2024-01-11: qty 12, 84d supply, fill #3
  Filled ????-??-??: fill #2

## 2023-04-08 NOTE — Patient Instructions (Signed)

## 2023-04-09 LAB — COMPREHENSIVE METABOLIC PANEL
AG Ratio: 1.9 (calc) (ref 1.0–2.5)
ALT: 16 U/L (ref 6–29)
AST: 20 U/L (ref 10–35)
Albumin: 4.8 g/dL (ref 3.6–5.1)
Alkaline phosphatase (APISO): 49 U/L (ref 37–153)
BUN: 15 mg/dL (ref 7–25)
CO2: 26 mmol/L (ref 20–32)
Calcium: 9.3 mg/dL (ref 8.6–10.4)
Chloride: 101 mmol/L (ref 98–110)
Creat: 0.67 mg/dL (ref 0.50–1.05)
Globulin: 2.5 g/dL (ref 1.9–3.7)
Glucose, Bld: 87 mg/dL (ref 65–99)
Potassium: 4.2 mmol/L (ref 3.5–5.3)
Sodium: 138 mmol/L (ref 135–146)
Total Bilirubin: 0.6 mg/dL (ref 0.2–1.2)
Total Protein: 7.3 g/dL (ref 6.1–8.1)

## 2023-04-09 LAB — VITAMIN D 25 HYDROXY (VIT D DEFICIENCY, FRACTURES): Vit D, 25-Hydroxy: 46 ng/mL (ref 30–100)

## 2023-04-09 LAB — CBC
HCT: 45.7 % — ABNORMAL HIGH (ref 35.0–45.0)
Hemoglobin: 14.7 g/dL (ref 11.7–15.5)
MCH: 29.4 pg (ref 27.0–33.0)
MCHC: 32.2 g/dL (ref 32.0–36.0)
MCV: 91.4 fL (ref 80.0–100.0)
MPV: 10.7 fL (ref 7.5–12.5)
Platelets: 256 10*3/uL (ref 140–400)
RBC: 5 10*6/uL (ref 3.80–5.10)
RDW: 13.4 % (ref 11.0–15.0)
WBC: 4.9 10*3/uL (ref 3.8–10.8)

## 2023-04-09 LAB — TSH: TSH: 2.26 m[IU]/L (ref 0.40–4.50)

## 2023-04-09 LAB — HEMOGLOBIN A1C
Hgb A1c MFr Bld: 5.9 %{Hb} — ABNORMAL HIGH (ref <5.7)
Mean Plasma Glucose: 123 mg/dL
eAG (mmol/L): 6.8 mmol/L

## 2023-04-09 LAB — LIPID PANEL
Cholesterol: 233 mg/dL — ABNORMAL HIGH (ref <200)
HDL: 87 mg/dL (ref >=50)
LDL Cholesterol (Calc): 127 mg/dL — ABNORMAL HIGH
Non-HDL Cholesterol (Calc): 146 mg/dL — ABNORMAL HIGH (ref <130)
Total CHOL/HDL Ratio: 2.7 (calc) (ref <5.0)
Triglycerides: 89 mg/dL (ref <150)

## 2023-04-10 ENCOUNTER — Other Ambulatory Visit (HOSPITAL_BASED_OUTPATIENT_CLINIC_OR_DEPARTMENT_OTHER): Payer: Self-pay

## 2023-04-10 ENCOUNTER — Other Ambulatory Visit: Payer: Self-pay | Admitting: Obstetrics and Gynecology

## 2023-04-10 DIAGNOSIS — Z1231 Encounter for screening mammogram for malignant neoplasm of breast: Secondary | ICD-10-CM

## 2023-04-14 IMAGING — MG MM DIGITAL SCREENING BILAT W/ TOMO AND CAD
8 series · 9 of 24 positions shown · non-contrast
Comparison: Previous exam(s).

CLINICAL DATA: Screening.

EXAM:
DIGITAL SCREENING BILATERAL MAMMOGRAM WITH TOMOSYNTHESIS AND CAD
TECHNIQUE: Bilateral screening digital craniocaudal and mediolateral oblique
mammograms were obtained. Bilateral screening digital breast
tomosynthesis was performed. The images were evaluated with
computer-aided detection.

[R MLO synth-2D]
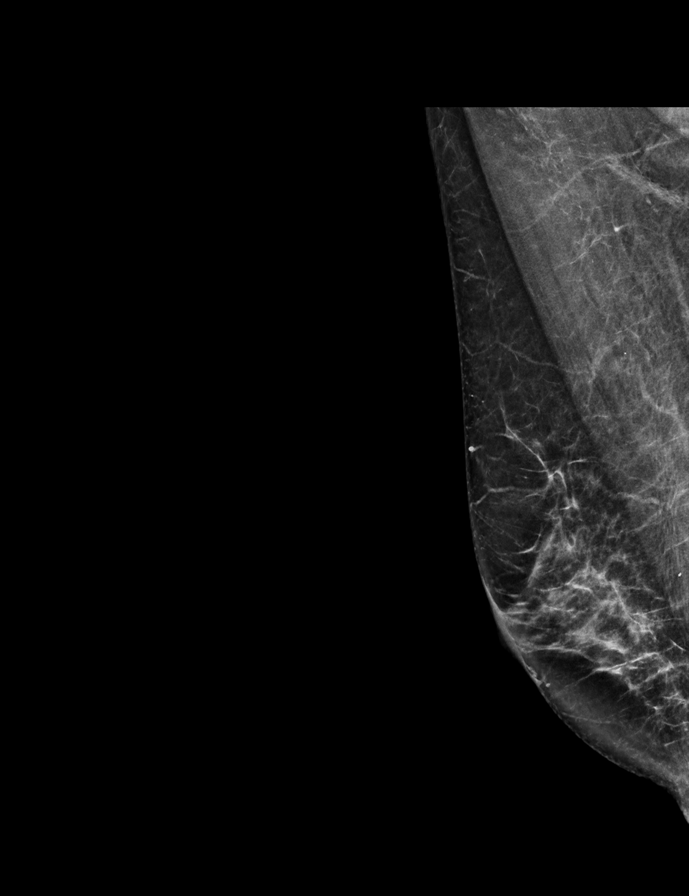

[L CC synth-2D]
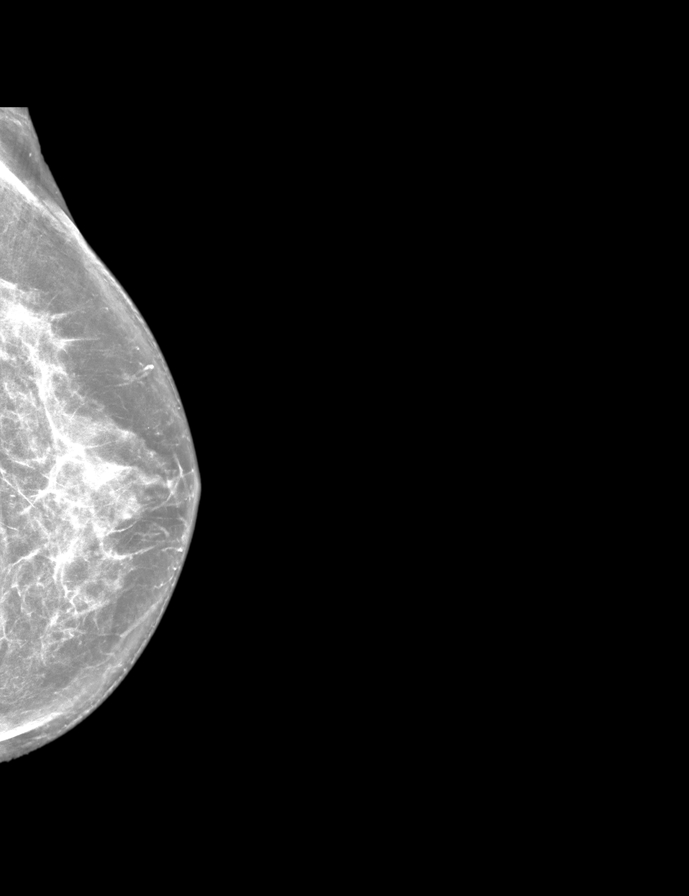

[L MLO synth-2D]
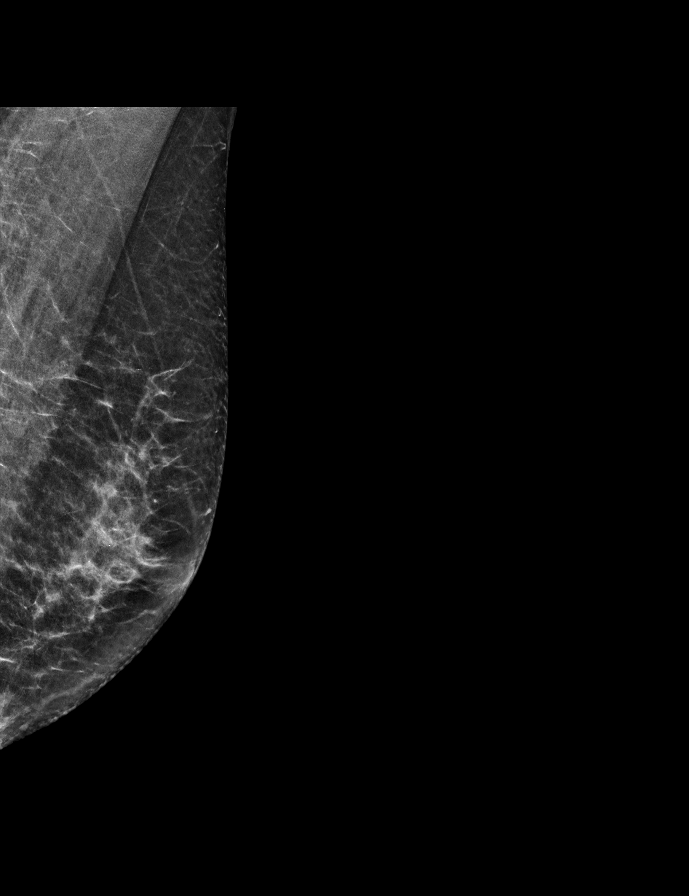

[R CC synth-2D]
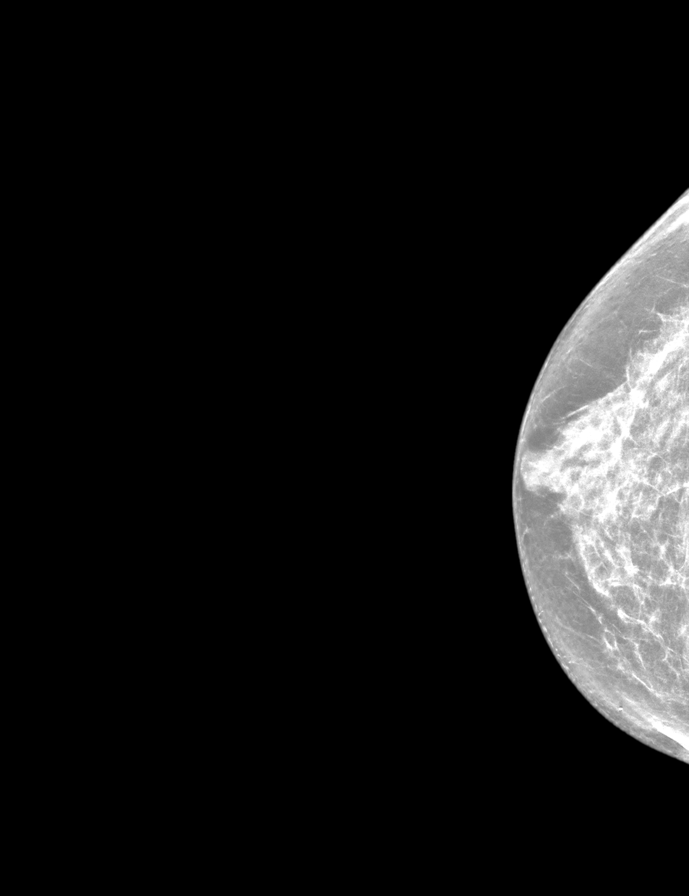

[L MLO tomo · 2 of 56 frames shown]
[frame 19/56]
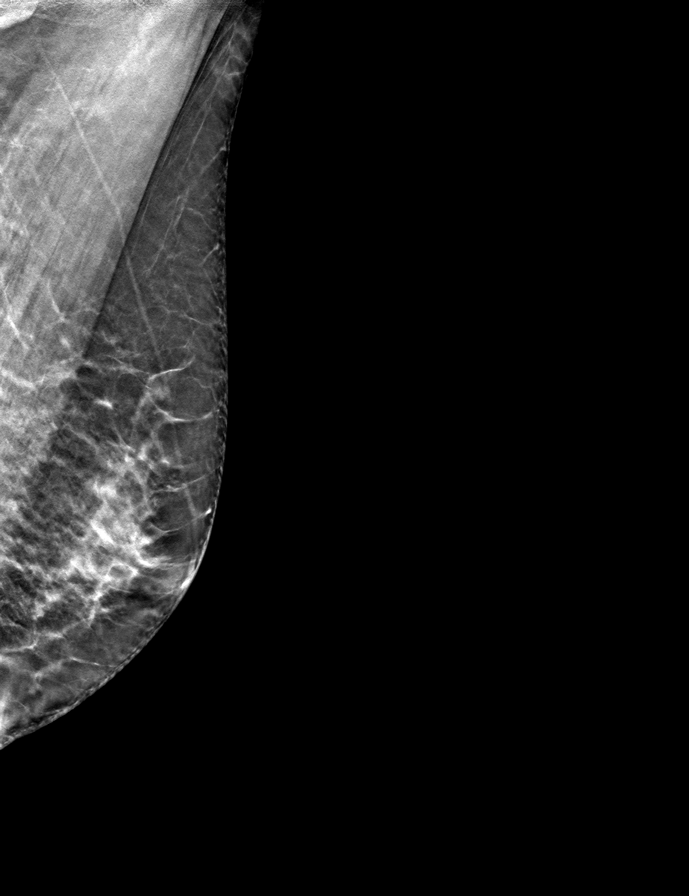
[frame 29/56]
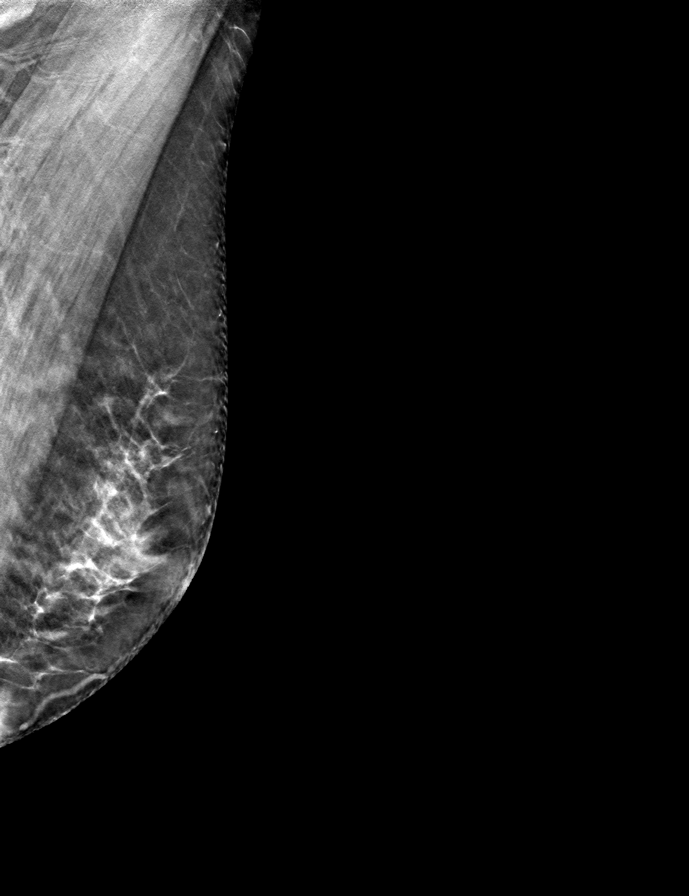

[R CC tomo · tomo slice 29/58.0]
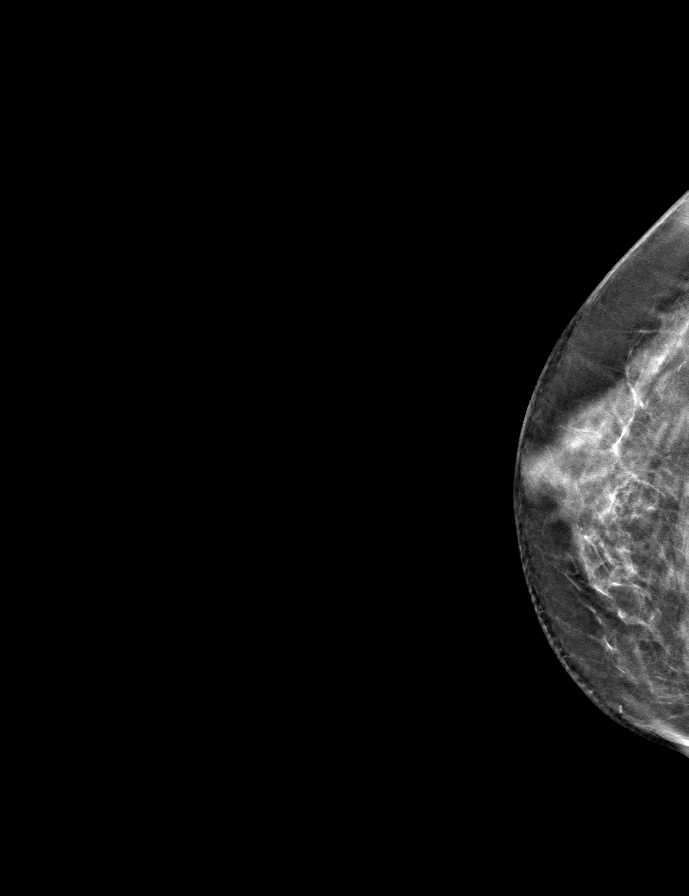

[L CC tomo · tomo slice 27/53.0]
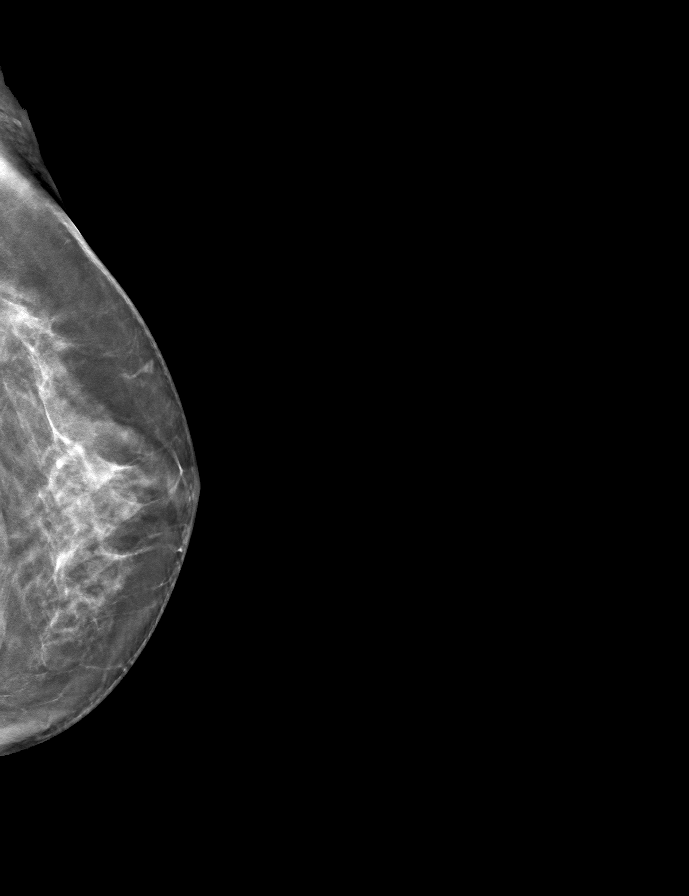

[R MLO tomo · tomo slice 30/59.0]
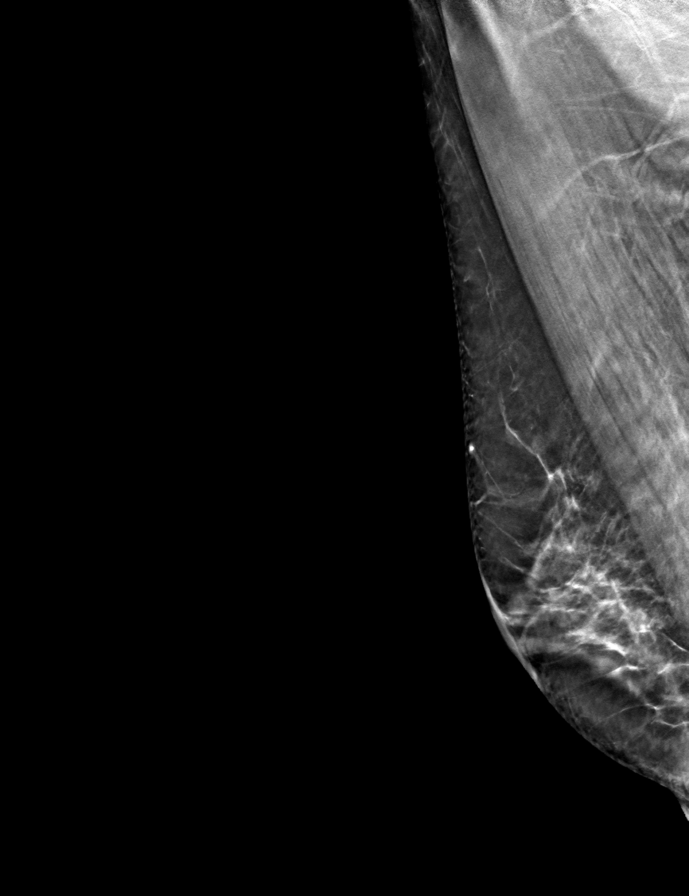

[9 of 24 positions shown; findings below may reference images not displayed]

ACR Breast Density Category c: The breast tissue is heterogeneously
dense, which may obscure small masses.
FINDINGS: There are no findings suspicious for malignancy.
IMPRESSION: No mammographic evidence of malignancy. A result letter of this
screening mammogram will be mailed directly to the patient.

RECOMMENDATION:
Screening mammogram in one year. (Code:[id number])

BI-RADS CATEGORY  1: Negative.

## 2023-04-21 DIAGNOSIS — L821 Other seborrheic keratosis: Secondary | ICD-10-CM | POA: Diagnosis not present

## 2023-04-21 DIAGNOSIS — L820 Inflamed seborrheic keratosis: Secondary | ICD-10-CM | POA: Diagnosis not present

## 2023-05-02 DIAGNOSIS — H43813 Vitreous degeneration, bilateral: Secondary | ICD-10-CM | POA: Diagnosis not present

## 2023-05-02 DIAGNOSIS — D330 Benign neoplasm of brain, supratentorial: Secondary | ICD-10-CM | POA: Diagnosis not present

## 2023-05-02 DIAGNOSIS — H2513 Age-related nuclear cataract, bilateral: Secondary | ICD-10-CM | POA: Diagnosis not present

## 2023-05-08 ENCOUNTER — Other Ambulatory Visit (HOSPITAL_BASED_OUTPATIENT_CLINIC_OR_DEPARTMENT_OTHER): Payer: Self-pay

## 2023-07-11 ENCOUNTER — Other Ambulatory Visit (HOSPITAL_BASED_OUTPATIENT_CLINIC_OR_DEPARTMENT_OTHER): Payer: Self-pay

## 2023-07-11 MED ORDER — COVID-19 MRNA VAC-TRIS(PFIZER) 30 MCG/0.3ML IM SUSY
0.3000 mL | PREFILLED_SYRINGE | Freq: Once | INTRAMUSCULAR | 0 refills | Status: AC
  Filled 2023-07-11: qty 0.3, 1d supply, fill #0

## 2023-07-28 ENCOUNTER — Encounter: Payer: Self-pay | Admitting: Obstetrics and Gynecology

## 2023-07-30 ENCOUNTER — Other Ambulatory Visit (HOSPITAL_BASED_OUTPATIENT_CLINIC_OR_DEPARTMENT_OTHER): Payer: Self-pay

## 2023-08-08 DIAGNOSIS — E7800 Pure hypercholesterolemia, unspecified: Secondary | ICD-10-CM | POA: Diagnosis not present

## 2023-08-08 DIAGNOSIS — Z0000 Encounter for general adult medical examination without abnormal findings: Secondary | ICD-10-CM | POA: Diagnosis not present

## 2023-10-24 ENCOUNTER — Other Ambulatory Visit (HOSPITAL_BASED_OUTPATIENT_CLINIC_OR_DEPARTMENT_OTHER): Payer: Self-pay

## 2023-11-26 DIAGNOSIS — L821 Other seborrheic keratosis: Secondary | ICD-10-CM | POA: Diagnosis not present

## 2023-11-26 DIAGNOSIS — D171 Benign lipomatous neoplasm of skin and subcutaneous tissue of trunk: Secondary | ICD-10-CM | POA: Diagnosis not present

## 2023-11-26 DIAGNOSIS — L820 Inflamed seborrheic keratosis: Secondary | ICD-10-CM | POA: Diagnosis not present

## 2023-11-26 DIAGNOSIS — L570 Actinic keratosis: Secondary | ICD-10-CM | POA: Diagnosis not present

## 2023-11-26 DIAGNOSIS — D2361 Other benign neoplasm of skin of right upper limb, including shoulder: Secondary | ICD-10-CM | POA: Diagnosis not present

## 2023-11-26 DIAGNOSIS — I788 Other diseases of capillaries: Secondary | ICD-10-CM | POA: Diagnosis not present

## 2023-11-26 DIAGNOSIS — D1801 Hemangioma of skin and subcutaneous tissue: Secondary | ICD-10-CM | POA: Diagnosis not present

## 2023-11-26 DIAGNOSIS — D2271 Melanocytic nevi of right lower limb, including hip: Secondary | ICD-10-CM | POA: Diagnosis not present

## 2024-01-08 ENCOUNTER — Ambulatory Visit (HOSPITAL_BASED_OUTPATIENT_CLINIC_OR_DEPARTMENT_OTHER)
Admission: RE | Admit: 2024-01-08 | Discharge: 2024-01-08 | Disposition: A | Discharge status: Home / Self Care | Source: Ambulatory Visit | Attending: Obstetrics and Gynecology | Admitting: Obstetrics and Gynecology

## 2024-01-08 DIAGNOSIS — M810 Age-related osteoporosis without current pathological fracture: Secondary | ICD-10-CM | POA: Insufficient documentation

## 2024-01-08 DIAGNOSIS — Z780 Asymptomatic menopausal state: Secondary | ICD-10-CM | POA: Diagnosis not present

## 2024-01-09 ENCOUNTER — Ambulatory Visit
Admission: RE | Admit: 2024-01-09 | Discharge: 2024-01-09 | Disposition: A | Discharge status: Home / Self Care | Payer: 59 | Source: Ambulatory Visit | Attending: Obstetrics and Gynecology | Admitting: Obstetrics and Gynecology

## 2024-01-09 ENCOUNTER — Other Ambulatory Visit: Payer: 59

## 2024-01-09 DIAGNOSIS — Z1231 Encounter for screening mammogram for malignant neoplasm of breast: Secondary | ICD-10-CM | POA: Diagnosis not present

## 2024-01-10 ENCOUNTER — Ambulatory Visit: Payer: Self-pay | Admitting: Obstetrics and Gynecology

## 2024-01-13 ENCOUNTER — Ambulatory Visit: Payer: Self-pay | Admitting: Obstetrics and Gynecology

## 2024-03-05 ENCOUNTER — Other Ambulatory Visit (HOSPITAL_BASED_OUTPATIENT_CLINIC_OR_DEPARTMENT_OTHER): Payer: Self-pay

## 2024-03-05 MED ORDER — FLUZONE 0.5 ML IM SUSY
0.5000 mL | PREFILLED_SYRINGE | Freq: Once | INTRAMUSCULAR | 0 refills | Status: AC
  Filled 2024-03-05: qty 0.5, 1d supply, fill #0

## 2024-04-02 ENCOUNTER — Other Ambulatory Visit (HOSPITAL_BASED_OUTPATIENT_CLINIC_OR_DEPARTMENT_OTHER): Payer: Self-pay

## 2024-04-02 MED ORDER — COMIRNATY 30 MCG/0.3ML IM SUSY
0.3000 mL | PREFILLED_SYRINGE | Freq: Once | INTRAMUSCULAR | 0 refills | Status: AC
  Filled 2024-04-02: qty 0.3, 1d supply, fill #0

## 2024-04-07 ENCOUNTER — Encounter: Payer: Self-pay | Admitting: Obstetrics and Gynecology

## 2024-04-07 NOTE — Progress Notes (Unsigned)
 68 y.o. G0P0000 Domestic Partner Caucasian female here for annual exam.    PCP: Pcp, No   Patient's last menstrual period was 05/10/2008.           Sexually active: Yes.    The current method of family planning is none.    Menopausal hormone therapy:  n/a Exercising: {yes no:314532}  {types:19826} Smoker:  no  OB History  Gravida Para Term Preterm AB Living  0 0 0 0 0 0  SAB IAB Ectopic Multiple Live Births  0 0 0 0 0     HEALTH MAINTENANCE: Last 2 paps:  02/20/21 neg HR HPV neg History of abnormal Pap or positive HPV:  no Mammogram:   01/09/24 Breast Density Cat C, BIRADS Cat 1 neg  Colonoscopy:  2023 at [facility] - due in 2028  Bone Density:  01/08/24  Result  osteoporosis    Immunization History  Administered Date(s) Administered   Influenza, Seasonal, Injecte, Preservative Fre 01/24/2023, 03/05/2024   Pfizer(Comirnaty )Fall Seasonal Vaccine 12 years and older 03/29/2022, 07/11/2023, 04/02/2024   Tdap 07/11/2017   Zoster Recombinant(Shingrix ) 06/28/2022, 10/04/2022      reports that she has never smoked. She has never used smokeless tobacco. She reports current alcohol use. She reports that she does not use drugs.  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   slight-moderate-right media/chest   Atypical nevus 05/08/2012   mild-left outer foot   Atypical nevus 04/25/2015   moderate-left leg (limitied margin free )   Atypical nevus 12/01/2015   mild-right superior thigh    Basal cell carcinoma 07/14/2018   super-left subauricular (CX35FU)   Elevated hemoglobin A1c    Heart murmur    Low vitamin D  level    Osteoporosis    Radial head fracture 2023   right    Past Surgical History:  Procedure Laterality Date   BRAIN SURGERY  2005   Choroid Plexus papilloma Benign    Current Outpatient Medications  Medication Sig Dispense Refill   alendronate  (FOSAMAX ) 70 MG tablet Take 1 tablet (70 mg total) by mouth every 7  (seven) days. Take first thing in am with 6 oz of water.  Be upright after taking.  Do not eat for one hour. 12 tablet 3   Calcium-Cholecalciferol-Zinc (VIACTIV CALCIUM IMMUNE) 650-20-5.5 MG-MCG-MG CHEW      Cholecalciferol (VITAMIN D3 PO) Take 5,000 Int'l Units by mouth.     COVID-19 mRNA vaccine 2023-2024 (COMIRNATY ) syringe Inject into the muscle. 0.3 mL 0   No current facility-administered medications for this visit.    ALLERGIES: Percocet [oxycodone-acetaminophen]  Family History  Problem Relation Age of Onset   Breast cancer Mother 34   Cancer Mother    COPD Father    Heart disease Father    Deep vein thrombosis Father    Osteoporosis Sister    Multiple sclerosis Sister    Bipolar disorder Sister     Review of Systems  PHYSICAL EXAM:  LMP 05/10/2008     General appearance: alert, cooperative and appears stated age Head: normocephalic, without obvious abnormality, atraumatic Neck: no adenopathy, supple, symmetrical, trachea midline and thyroid  normal to inspection and palpation Lungs: clear to auscultation bilaterally Breasts: normal appearance, no masses or tenderness, No nipple retraction or dimpling, No nipple discharge or bleeding, No axillary adenopathy Heart: regular rate and rhythm Abdomen: soft, non-tender; no masses, no organomegaly Extremities: extremities normal, atraumatic, no cyanosis or edema Skin: skin  color, texture, turgor normal. No rashes or lesions Lymph nodes: cervical, supraclavicular, and axillary nodes normal. Neurologic: grossly normal  Pelvic: External genitalia:  no lesions              No abnormal inguinal nodes palpated.              Urethra:  normal appearing urethra with no masses, tenderness or lesions              Bartholins and Skenes: normal                 Vagina: normal appearing vagina with normal color and discharge, no lesions              Cervix: no lesions              Pap taken: {yes no:314532} Bimanual Exam:  Uterus:   normal size, contour, position, consistency, mobility, non-tender              Adnexa: no mass, fullness, tenderness              Rectal exam: {yes no:314532}.  Confirms.              Anus:  normal sphincter tone, no lesions  Chaperone was present for exam:  {BSCHAPERONE:31226::Emily F, CMA}  ASSESSMENT: Well woman visit with gynecologic exam.  PHQ-2-9: ***  ***  PLAN: Mammogram screening discussed. Self breast awareness reviewed. Pap and HRV collected:  {yes no:314532} Guidelines for Calcium, Vitamin D , regular exercise program including cardiovascular and weight bearing exercise. Medication refills:  *** {LABS (Optional):23779} Follow up:  ***    Additional counseling given.  {yes [id number]. ***  total time was spent for this patient encounter, including preparation, face-to-face counseling with the patient, coordination of care, and documentation of the encounter in addition to doing the well woman visit with gynecologic exam.

## 2024-04-08 ENCOUNTER — Other Ambulatory Visit (HOSPITAL_BASED_OUTPATIENT_CLINIC_OR_DEPARTMENT_OTHER): Payer: Self-pay

## 2024-04-08 ENCOUNTER — Encounter: Payer: Self-pay | Admitting: Obstetrics and Gynecology

## 2024-04-08 ENCOUNTER — Ambulatory Visit (INDEPENDENT_AMBULATORY_CARE_PROVIDER_SITE_OTHER): Payer: 59 | Admitting: Obstetrics and Gynecology

## 2024-04-08 VITALS — BP 118/82 | HR 62 | Ht 70.0 in | Wt 131.0 lb

## 2024-04-08 DIAGNOSIS — Z01419 Encounter for gynecological examination (general) (routine) without abnormal findings: Secondary | ICD-10-CM

## 2024-04-08 DIAGNOSIS — Z1331 Encounter for screening for depression: Secondary | ICD-10-CM

## 2024-04-08 DIAGNOSIS — M810 Age-related osteoporosis without current pathological fracture: Secondary | ICD-10-CM | POA: Diagnosis not present

## 2024-04-08 DIAGNOSIS — Z0000 Encounter for general adult medical examination without abnormal findings: Secondary | ICD-10-CM

## 2024-04-08 MED ORDER — ALENDRONATE SODIUM 70 MG PO TABS
70.0000 mg | ORAL_TABLET | ORAL | 3 refills | Status: AC
  Filled 2024-04-08: qty 12, 84d supply, fill #0

## 2024-04-08 NOTE — Patient Instructions (Addendum)
Denosumab Injection (Osteoporosis) What is this medication? DENOSUMAB (den oh SUE mab) prevents and treats osteoporosis. It works by Interior and spatial designer stronger and less likely to break (fracture). It is a monoclonal antibody. This medicine may be used for other purposes; ask your health care provider or pharmacist if you have questions. COMMON BRAND NAME(S): Prolia What should I tell my care team before I take this medication? They need to know if you have any of these conditions: Dental or gum disease Had thyroid or parathyroid (glands located in neck) surgery Having dental surgery or a tooth pulled Kidney disease Low levels of calcium in the blood On dialysis Poor nutrition Thyroid disease Trouble absorbing nutrients from your food An unusual or allergic reaction to denosumab, other medications, foods, dyes, or preservatives Pregnant or trying to get pregnant Breastfeeding How should I use this medication? This medication is injected under the skin. It is given by your care team in a hospital or clinic setting. A special MedGuide will be given to you before each treatment. Be sure to read this information carefully each time. Talk to your care team about the use of this medication in children. Special care may be needed. Overdosage: If you think you have taken too much of this medicine contact a poison control center or emergency room at once. NOTE: This medicine is only for you. Do not share this medicine with others. What if I miss a dose? Keep appointments for follow-up doses. It is important not to miss your dose. Call your care team if you are unable to keep an appointment. What may interact with this medication? Do not take this medication with any of the following: Other medications that contain denosumab This medication may also interact with the following: Medications that lower your chance of fighting infection Steroid medications, such as prednisone or cortisone This  list may not describe all possible interactions. Give your health care provider a list of all the medicines, herbs, non-prescription drugs, or dietary supplements you use. Also tell them if you smoke, drink alcohol, or use illegal drugs. Some items may interact with your medicine. What should I watch for while using this medication? Your condition will be monitored carefully while you are receiving this medication. You may need blood work done while taking this medication. This medication may increase your risk of getting an infection. Call your care team for advice if you get a fever, chills, sore throat, or other symptoms of a cold or flu. Do not treat yourself. Try to avoid being around people who are sick. Tell your dentist and dental surgeon that you are taking this medication. You should not have major dental surgery while on this medication. See your dentist to have a dental exam and fix any dental problems before starting this medication. Take good care of your teeth while on this medication. Make sure you see your dentist for regular follow-up appointments. This medication may cause low levels of calcium in your body. The risk of severe side effects is increased in people with kidney disease. Your care team may prescribe calcium and vitamin D to help prevent low calcium levels while you take this medication. It is important to take calcium and vitamin D as directed by your care team. Talk to your care team if you may be pregnant. Serious birth defects may occur if you take this medication during pregnancy and for 5 months after the last dose. You will need a negative pregnancy test before starting this medication. Contraception  is recommended while taking this medication and for 5 months after the last dose. Your care team can help you find the option that works for you. Talk to your care team before breastfeeding. Changes to your treatment plan may be needed. What side effects may I notice from  receiving this medication? Side effects that you should report to your care team as soon as possible: Allergic reactions--skin rash, itching, hives, swelling of the face, lips, tongue, or throat Infection--fever, chills, cough, sore throat, wounds that don't heal, pain or trouble when passing urine, general feeling of discomfort or being unwell Low calcium level--muscle pain or cramps, confusion, tingling, or numbness in the hands or feet Osteonecrosis of the jaw--pain, swelling, or redness in the mouth, numbness of the jaw, poor healing after dental work, unusual discharge from the mouth, visible bones in the mouth Severe bone, joint, or muscle pain Skin infection--skin redness, swelling, warmth, or pain Side effects that usually do not require medical attention (report these to your care team if they continue or are bothersome): Back pain Headache Joint pain Muscle pain Pain in the hands, arms, legs, or feet Runny or stuffy nose Sore throat This list may not describe all possible side effects. Call your doctor for medical advice about side effects. You may report side effects to FDA at 1-800-FDA-1088. Where should I keep my medication? This medication is given in a hospital or clinic. It will not be stored at home. NOTE: This sheet is a summary. It may not cover all possible information. If you have questions about this medicine, talk to your doctor, pharmacist, or health care provider.  2024 Elsevier/Gold Standard (2022-06-11 00:00:00)   EXERCISE AND DIET:  We recommended that you start or continue a regular exercise program for good health. Regular exercise means any activity that makes your heart beat faster and makes you sweat.  We recommend exercising at least 30 minutes per day at least 3 days a week, preferably 4 or 5.  We also recommend a diet low in fat and sugar.  Inactivity, poor dietary choices and obesity can cause diabetes, heart attack, stroke, and kidney damage, among  others.    ALCOHOL AND SMOKING:  Women should limit their alcohol intake to no more than 7 drinks/beers/glasses of wine (combined, not each!) per week. Moderation of alcohol intake to this level decreases your risk of breast cancer and liver damage. And of course, no recreational drugs are part of a healthy lifestyle.  And absolutely no smoking or even second hand smoke. Most people know smoking can cause heart and lung diseases, but did you know it also contributes to weakening of your bones? Aging of your skin?  Yellowing of your teeth and nails?  CALCIUM AND VITAMIN D:  Adequate intake of calcium and Vitamin D are recommended.  The recommendations for exact amounts of these supplements seem to change often, but generally speaking 600 mg of calcium (either carbonate or citrate) and 800 units of Vitamin D per day seems prudent. Certain women may benefit from higher intake of Vitamin D.  If you are among these women, your doctor will have told you during your visit.    PAP SMEARS:  Pap smears, to check for cervical cancer or precancers,  have traditionally been done yearly, although recent scientific advances have shown that most women can have pap smears less often.  However, every woman still should have a physical exam from her gynecologist every year. It will include a breast check, inspection  of the vulva and vagina to check for abnormal growths or skin changes, a visual exam of the cervix, and then an exam to evaluate the size and shape of the uterus and ovaries.  And after 67 years of age, a rectal exam is indicated to check for rectal cancers. We will also provide age appropriate advice regarding health maintenance, like when you should have certain vaccines, screening for sexually transmitted diseases, bone density testing, colonoscopy, mammograms, etc.   MAMMOGRAMS:  All women over 80 years old should have a yearly mammogram. Many facilities now offer a "3D" mammogram, which may cost around $50  extra out of pocket. If possible,  we recommend you accept the option to have the 3D mammogram performed.  It both reduces the number of women who will be called back for extra views which then turn out to be normal, and it is better than the routine mammogram at detecting truly abnormal areas.    COLONOSCOPY:  Colonoscopy to screen for colon cancer is recommended for all women at age 47.  We know, you hate the idea of the prep.  We agree, BUT, having colon cancer and not knowing it is worse!!  Colon cancer so often starts as a polyp that can be seen and removed at colonscopy, which can quite literally save your life!  And if your first colonoscopy is normal and you have no family history of colon cancer, most women don't have to have it again for 10 years.  Once every ten years, you can do something that may end up saving your life, right?  We will be happy to help you get it scheduled when you are ready.  Be sure to check your insurance coverage so you understand how much it will cost.  It may be covered as a preventative service at no cost, but you should check your particular policy.

## 2024-04-09 ENCOUNTER — Ambulatory Visit: Payer: Self-pay | Admitting: Obstetrics and Gynecology

## 2024-04-09 LAB — COMPREHENSIVE METABOLIC PANEL WITH GFR
AG Ratio: 1.7 (calc) (ref 1.0–2.5)
ALT: 18 U/L (ref 6–29)
AST: 24 U/L (ref 10–35)
Albumin: 4.7 g/dL (ref 3.6–5.1)
Alkaline phosphatase (APISO): 43 U/L (ref 37–153)
BUN: 16 mg/dL (ref 7–25)
CO2: 27 mmol/L (ref 20–32)
Calcium: 9.4 mg/dL (ref 8.6–10.4)
Chloride: 102 mmol/L (ref 98–110)
Creat: 0.71 mg/dL (ref 0.50–1.05)
Globulin: 2.8 g/dL (ref 1.9–3.7)
Glucose, Bld: 89 mg/dL (ref 65–99)
Potassium: 4.7 mmol/L (ref 3.5–5.3)
Sodium: 139 mmol/L (ref 135–146)
Total Bilirubin: 0.6 mg/dL (ref 0.2–1.2)
Total Protein: 7.5 g/dL (ref 6.1–8.1)
eGFR: 93 mL/min/1.73m2 (ref >=60)

## 2024-04-09 LAB — LIPID PANEL
Cholesterol: 256 mg/dL — ABNORMAL HIGH (ref <200)
HDL: 87 mg/dL (ref >=50)
LDL Cholesterol (Calc): 148 mg/dL — ABNORMAL HIGH
Non-HDL Cholesterol (Calc): 169 mg/dL — ABNORMAL HIGH (ref <130)
Total CHOL/HDL Ratio: 2.9 (calc) (ref <5.0)
Triglycerides: 97 mg/dL (ref <150)

## 2024-04-09 LAB — CBC
HCT: 45.1 % — ABNORMAL HIGH (ref 35.0–45.0)
Hemoglobin: 14.7 g/dL (ref 11.7–15.5)
MCH: 29.9 pg (ref 27.0–33.0)
MCHC: 32.6 g/dL (ref 32.0–36.0)
MCV: 91.9 fL (ref 80.0–100.0)
MPV: 11.5 fL (ref 7.5–12.5)
Platelets: 267 Thousand/uL (ref 140–400)
RBC: 4.91 Million/uL (ref 3.80–5.10)
RDW: 13.3 % (ref 11.0–15.0)
WBC: 4.6 Thousand/uL (ref 3.8–10.8)

## 2024-04-09 LAB — TSH: TSH: 1.84 m[IU]/L (ref 0.40–4.50)

## 2024-04-09 LAB — HEMOGLOBIN A1C
Hgb A1c MFr Bld: 5.8 % — ABNORMAL HIGH (ref <5.7)
Mean Plasma Glucose: 120 mg/dL
eAG (mmol/L): 6.6 mmol/L

## 2024-04-09 LAB — VITAMIN D 25 HYDROXY (VIT D DEFICIENCY, FRACTURES): Vit D, 25-Hydroxy: 64 ng/mL (ref 30–100)

## 2025-04-13 ENCOUNTER — Encounter: Admitting: Obstetrics and Gynecology
